# Patient Record
Sex: Male | Born: 1990 | Race: Black or African American | Hispanic: No | Marital: Single | Smoking: Never smoker
Health system: Southern US, Community
[De-identification: ages and names within clinical notes are randomized; demographics above are authoritative.]

## PROBLEM LIST (undated history)

## (undated) DIAGNOSIS — E669 Obesity, unspecified: Secondary | ICD-10-CM

## (undated) DIAGNOSIS — J45909 Unspecified asthma, uncomplicated: Secondary | ICD-10-CM

## (undated) HISTORY — PX: DENTAL SURGERY: SHX609

---
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
## 2013-07-26 ENCOUNTER — Emergency Department (HOSPITAL_COMMUNITY): Payer: Medicaid Other

## 2013-07-26 ENCOUNTER — Emergency Department (HOSPITAL_COMMUNITY)
Admission: EM | Admit: 2013-07-26 | Discharge: 2013-07-26 | Disposition: A | Discharge status: Home / Self Care | Payer: Medicaid Other | Attending: Emergency Medicine | Admitting: Emergency Medicine

## 2013-07-26 ENCOUNTER — Encounter (HOSPITAL_COMMUNITY): Payer: Self-pay | Admitting: Emergency Medicine

## 2013-07-26 DIAGNOSIS — R059 Cough, unspecified: Secondary | ICD-10-CM | POA: Insufficient documentation

## 2013-07-26 DIAGNOSIS — H9190 Unspecified hearing loss, unspecified ear: Secondary | ICD-10-CM | POA: Insufficient documentation

## 2013-07-26 DIAGNOSIS — R6889 Other general symptoms and signs: Secondary | ICD-10-CM | POA: Insufficient documentation

## 2013-07-26 DIAGNOSIS — J3489 Other specified disorders of nose and nasal sinuses: Secondary | ICD-10-CM | POA: Insufficient documentation

## 2013-07-26 DIAGNOSIS — E669 Obesity, unspecified: Secondary | ICD-10-CM | POA: Insufficient documentation

## 2013-07-26 DIAGNOSIS — R05 Cough: Secondary | ICD-10-CM | POA: Insufficient documentation

## 2013-07-26 DIAGNOSIS — J45909 Unspecified asthma, uncomplicated: Secondary | ICD-10-CM | POA: Insufficient documentation

## 2013-07-26 DIAGNOSIS — R0981 Nasal congestion: Secondary | ICD-10-CM

## 2013-07-26 HISTORY — DX: Unspecified asthma, uncomplicated: J45.909

## 2013-07-26 HISTORY — DX: Obesity, unspecified: E66.9

## 2013-07-26 MED ORDER — IPRATROPIUM BROMIDE 0.06 % NA SOLN
2.0000 | Freq: Three times a day (TID) | NASAL | Status: DC
  Administered 2013-07-26: 2 via NASAL
  Filled 2013-07-26: qty 15

## 2013-07-26 MED ORDER — IPRATROPIUM BROMIDE 0.03 % NA SOLN: 2.0000 | Freq: Three times a day (TID) | NASAL | Status: DC

## 2013-07-26 NOTE — ED Notes (Signed)
Pt. reports nasal congestion /productive cough onset yesterday unrelieved by OTC medications . Respirations unlabored .

## 2013-07-26 NOTE — ED Provider Notes (Signed)
CSN: [id number]     Arrival date & time 07/26/13  1955 History   First MD Initiated Contact with Patient 07/26/13 2042     Chief Complaint  Patient presents with  . Nasal Congestion  . Cough     (Consider location/radiation/quality/duration/timing/severity/associated sxs/prior Treatment) HPI Comments: Patient states, for the past, month.  He's had some nasal congestion, has gotten worse over the past couple days, to include sneezing.  At this time, has new personal history of seasonal allergies.  He does have a history of asthma, but denies any shortness of breath, wheezing, denies any fever, sinus pressure.  eye pain, rhinitis Patient states he took one Alka-Seltzer plus, and helped.  His congestion, for about 20 minutes, today  Patient is a 23 y.o. male presenting with URI. The history is provided by the patient.  URI Presenting symptoms: congestion   Presenting symptoms: no cough, no ear pain, no fever, no rhinorrhea and no sore throat   Severity:  Moderate Onset quality:  Gradual Timing:  Constant Progression:  Worsening Chronicity:  New Relieved by:  None tried Worsened by:  Nothing tried Ineffective treatments:  None tried Associated symptoms: sneezing   Associated symptoms: no headaches, no neck pain, no sinus pain and no wheezing   Risk factors: no sick contacts     Past Medical History  Diagnosis Date  . Asthma   . Obesity    History reviewed. No pertinent past surgical history. No family history on file. History  Substance Use Topics  . Smoking status: Never Smoker   . Smokeless tobacco: Not on file  . Alcohol Use: No    Review of Systems  Constitutional: Negative for fever.  HENT: Positive for congestion, hearing loss and sneezing. Negative for ear discharge, ear pain, rhinorrhea, sinus pressure, sore throat and trouble swallowing.   Eyes: Negative for pain.  Respiratory: Negative for cough and wheezing.   Musculoskeletal: Negative for neck pain.   Neurological: Negative for headaches.  All other systems reviewed and are negative.      Allergies  Benadryl and Kiwi extract  Home Medications  No current outpatient prescriptions on file. BP 135/68  Pulse 102  Temp(Src) 99.9 F (37.7 C) (Oral)  Resp 14  SpO2 96% Physical Exam  Nursing note and vitals reviewed. Constitutional: He appears well-developed and well-nourished.  Morbidly obese  HENT:  Head: Normocephalic.  Right Ear: External ear normal.  Left Ear: Decreased hearing is noted.  Nose: Mucosal edema present. No rhinorrhea, sinus tenderness or nasal septal hematoma. No epistaxis. Right sinus exhibits no maxillary sinus tenderness and no frontal sinus tenderness. Left sinus exhibits no maxillary sinus tenderness and no frontal sinus tenderness.  Patient has a cerumen impaction in his left ear  Eyes: Pupils are equal, round, and reactive to light.  Neck: Normal range of motion. Neck supple.  Cardiovascular: Normal rate.   Pulmonary/Chest: Effort normal and breath sounds normal. No respiratory distress. He has no wheezes.  Lymphadenopathy:    He has no cervical adenopathy.  Neurological: He is alert.  Skin: Skin is warm and dry. No erythema.    ED Course  Procedures (including critical care time) Labs Review Labs Reviewed - No data to display Imaging Review No results found.   EKG Interpretation None      MDM  Asked that the patient be given, an Atrovent nasal inhaler, which she will be able to go home with is to use this 2 sprays to each nostril.  3 times  a day.  I will also refer him to ENT at this time.  He is not wheezing.  He does not report any episodes of wheezing, or shortness of breath.  This appears to be all allergy-induced first is URI  Final diagnoses:  Nasal sinus congestion         Arman Filter, NP 07/26/13 2112

## 2013-07-26 NOTE — Discharge Instructions (Signed)
You have been supplied with a nasal spray that, you can safely use over the next several weeks, 2 sprays to each nostril 2-3 times, daily.  You've also been referred to an ENT specialist, if needed

## 2013-07-26 NOTE — ED Notes (Signed)
Patient transported to X-ray 

## 2013-07-26 NOTE — ED Notes (Signed)
Patient returned from X-ray 

## 2013-07-27 NOTE — ED Provider Notes (Signed)
Medical screening examination/treatment/procedure(s) were performed by non-physician practitioner and as supervising physician I was immediately available for consultation/collaboration.   EKG Interpretation None        Keelen Quevedo N Trigger Frasier, DO 07/27/13 0006 

## 2013-11-15 ENCOUNTER — Emergency Department (HOSPITAL_COMMUNITY)
Admission: EM | Admit: 2013-11-15 | Discharge: 2013-11-15 | Disposition: A | Discharge status: Home / Self Care | Payer: Medicaid Other | Attending: Emergency Medicine | Admitting: Emergency Medicine

## 2013-11-15 ENCOUNTER — Encounter (HOSPITAL_COMMUNITY): Payer: Self-pay | Admitting: Emergency Medicine

## 2013-11-15 DIAGNOSIS — R55 Syncope and collapse: Secondary | ICD-10-CM | POA: Diagnosis present

## 2013-11-15 DIAGNOSIS — E669 Obesity, unspecified: Secondary | ICD-10-CM | POA: Insufficient documentation

## 2013-11-15 DIAGNOSIS — E860 Dehydration: Secondary | ICD-10-CM | POA: Insufficient documentation

## 2013-11-15 DIAGNOSIS — G4700 Insomnia, unspecified: Secondary | ICD-10-CM | POA: Diagnosis not present

## 2013-11-15 DIAGNOSIS — Y9302 Activity, running: Secondary | ICD-10-CM | POA: Diagnosis not present

## 2013-11-15 DIAGNOSIS — T675XXA Heat exhaustion, unspecified, initial encounter: Secondary | ICD-10-CM | POA: Diagnosis not present

## 2013-11-15 DIAGNOSIS — Y929 Unspecified place or not applicable: Secondary | ICD-10-CM | POA: Diagnosis not present

## 2013-11-15 DIAGNOSIS — X30XXXA Exposure to excessive natural heat, initial encounter: Secondary | ICD-10-CM | POA: Insufficient documentation

## 2013-11-15 DIAGNOSIS — F410 Panic disorder [episodic paroxysmal anxiety] without agoraphobia: Secondary | ICD-10-CM | POA: Insufficient documentation

## 2013-11-15 DIAGNOSIS — J45909 Unspecified asthma, uncomplicated: Secondary | ICD-10-CM | POA: Insufficient documentation

## 2013-11-15 LAB — BASIC METABOLIC PANEL
ANION GAP: 14 (ref 5–15)
BUN: 10 mg/dL (ref 6–23)
CALCIUM: 10.2 mg/dL (ref 8.4–10.5)
CHLORIDE: 100 meq/L (ref 96–112)
CO2: 25 meq/L (ref 19–32)
Creatinine, Ser: 1.25 mg/dL (ref 0.50–1.35)
GFR calc Af Amer: >90 mL/min (ref >90)
GFR calc non Af Amer: 82 mL/min — ABNORMAL LOW (ref >90)
Glucose, Bld: 115 mg/dL — ABNORMAL HIGH (ref 70–99)
POTASSIUM: 4.8 meq/L (ref 3.7–5.3)
SODIUM: 139 meq/L (ref 137–147)

## 2013-11-15 LAB — CBC
HCT: 43.9 % (ref 39.0–52.0)
Hemoglobin: 14.9 g/dL (ref 13.0–17.0)
MCH: 30 pg (ref 26.0–34.0)
MCHC: 33.9 g/dL (ref 30.0–36.0)
MCV: 88.3 fL (ref 78.0–100.0)
PLATELETS: 277 10*3/uL (ref 150–400)
RBC: 4.97 MIL/uL (ref 4.22–5.81)
RDW: 12.2 % (ref 11.5–15.5)
WBC: 12.5 10*3/uL — AB (ref 4.0–10.5)

## 2013-11-15 LAB — RAPID URINE DRUG SCREEN, HOSP PERFORMED
Amphetamines: NOT DETECTED
BENZODIAZEPINES: NOT DETECTED
Barbiturates: NOT DETECTED
Cocaine: NOT DETECTED
OPIATES: NOT DETECTED
Tetrahydrocannabinol: NOT DETECTED

## 2013-11-15 LAB — TROPONIN I

## 2013-11-15 LAB — CK: CK TOTAL: 192 U/L (ref 7–232)

## 2013-11-15 MED ORDER — SODIUM CHLORIDE 0.9 % IV SOLN
1000.0000 mL | Freq: Once | INTRAVENOUS | Status: AC
  Administered 2013-11-15: 1000 mL via INTRAVENOUS

## 2013-11-15 MED ORDER — SODIUM CHLORIDE 0.9 % IV BOLUS (SEPSIS)
1000.0000 mL | Freq: Once | INTRAVENOUS | Status: AC
  Administered 2013-11-15: 1000 mL via INTRAVENOUS

## 2013-11-15 MED ORDER — SODIUM CHLORIDE 0.9 % IV SOLN
1000.0000 mL | INTRAVENOUS | Status: DC
  Administered 2013-11-15: 1000 mL via INTRAVENOUS

## 2013-11-15 NOTE — ED Provider Notes (Signed)
CSN: [id number]     Arrival date & time 11/15/13  1315 History   First MD Initiated Contact with Patient 11/15/13 1325     Chief Complaint  Patient presents with  . Near Syncope     (Consider location/radiation/quality/duration/timing/severity/associated sxs/prior Treatment) HPI Patient reports he works 2 jobs. He works at [employer] Monday through Friday, he goes to [employer] Monday through Friday, and on the weekends he works at [employer]. He reports he has been on probation at [facility] for being late to work. He was called in to work today, however, he missed his bus. He states he jogged to work(states he used to run track, played football, and do wrestling in high school) and estimates it was about 6 miles. He states he ran it in a little over an hour. He reports it is hot today and he was sweating a lot. He states that he got to work he felt fine however he started having trouble standing up straight without getting very nauseated. He states he started getting worse and then started crying and shaking. He states he felt like he was going to pass out. He denies vomiting, chest pain, extremity cramping, diarrhea. He states he's never felt this way before. He states his manager told him he looked like he was having a panic attack.he reports he continued to have some dizziness while in the ambulance.  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 He however reports he has not been able to sleep for the past couple of years. He states he can go as long as 4 days without sleeping. However, he states typically it can be 30 hours. He states he cannot sleep and then suddenly it hits him and then he has to sleep. He states he may have some depression, he denies suicidal or homicidal ideation. He does report being under a lot of stress. He has thought about getting evaluated for the insomnia however he has not had time.   PCP none  Past Medical History  Diagnosis Date   . Asthma   . Obesity    No past surgical history on file. No family history on file. History  Substance Use Topics  . Smoking status: Never Smoker   . Smokeless tobacco: Not on file  . Alcohol Use: No  employed Psychiatric nurse  Review of Systems  All other systems reviewed and are negative.     Allergies  Benadryl and Kiwi extract  Home Medications   Prior to Admission medications   Not on File   BP 127/65  Pulse 92  Temp(Src) 98.2 F (36.8 C) (Oral)  Resp 18  SpO2 100%  Vital signs normal    Orthostatic VS + for HR when standing and borderline for drop in BP on standing aOrthostatic Lying - BP- Lying: 123/66 mmHg ; Pulse- Lying: 92  Orthostatic Sitting - BP- Sitting: 121/67 mmHg ; Pulse- Sitting: 85  Orthostatic Standing at 0 minutes - BP- Standing at 0 minutes: 109/50 mmHg ; Pulse- Standing at 0 minutes: 105   Physical Exam  Nursing note and vitals reviewed. Constitutional: He is oriented to person, place, and time. He appears well-developed and well-nourished.  Non-toxic appearance. He does not appear ill. No distress.  obese  HENT:  Head: Normocephalic and atraumatic.  Right Ear: External ear normal.  Left Ear: External ear normal.  Nose: Nose normal. No mucosal edema or rhinorrhea.  Mouth/Throat:  Oropharynx is clear and moist and mucous membranes are normal. No dental abscesses or uvula swelling.  Eyes: Conjunctivae and EOM are normal. Pupils are equal, round, and reactive to light.  Neck: Normal range of motion and full passive range of motion without pain. Neck supple.  Cardiovascular: Normal rate, regular rhythm and normal heart sounds.  Exam reveals no gallop and no friction rub.   No murmur heard. Pulmonary/Chest: Effort normal and breath sounds normal. No respiratory distress. He has no wheezes. He has no rhonchi. He has no rales. He exhibits no tenderness and no crepitus.  Abdominal: Soft. Normal appearance and bowel sounds are normal.  He exhibits no distension. There is no tenderness. There is no rebound and no guarding.  Musculoskeletal: Normal range of motion. He exhibits no edema and no tenderness.  Moves all extremities well.   Neurological: He is alert and oriented to person, place, and time. He has normal strength. No cranial nerve deficit.  Skin: Skin is warm, dry and intact. No rash noted. No erythema. No pallor.  Psychiatric: His speech is normal. He is slowed. He expresses no homicidal and no suicidal ideation.  Flat affect    ED Course  Procedures (including critical care time)  Medications  0.9 %  sodium chloride infusion (0 mLs Intravenous Stopped 11/15/13 1540)    Followed by  0.9 %  sodium chloride infusion (1,000 mLs Intravenous New Bag/Given 11/15/13 1542)    Followed by  0.9 %  sodium chloride infusion (not administered)  sodium chloride 0.9 % bolus 1,000 mL (not administered)   Recheck after 2 liter bolus has only about 200 cc of dark urine output. Pt state he is feeling better. Tongue is still dry. Will given more fluids.    Labs Review Results for orders placed during the hospital encounter of 11/15/13  CBC      Result Value Ref Range   WBC 12.5 (*) 4.0 - 10.5 K/uL   RBC 4.97  4.22 - 5.81 MIL/uL   Hemoglobin 14.9  13.0 - 17.0 g/dL   HCT 16.143.9  09.639.0 - 04.552.0 %   MCV 88.3  78.0 - 100.0 fL   MCH 30.0  26.0 - 34.0 pg   MCHC 33.9  30.0 - 36.0 g/dL   RDW 40.912.2  81.111.5 - 91.415.5 %   Platelets 277  150 - 400 K/uL  BASIC METABOLIC PANEL      Result Value Ref Range   Sodium 139  137 - 147 mEq/L   Potassium 4.8  3.7 - 5.3 mEq/L   Chloride 100  96 - 112 mEq/L   CO2 25  19 - 32 mEq/L   Glucose, Bld 115 (*) 70 - 99 mg/dL   BUN 10  6 - 23 mg/dL   Creatinine, Ser 7.821.25  0.50 - 1.35 mg/dL   Calcium 95.610.2  8.4 - 21.310.5 mg/dL   GFR calc non Af Amer 82 (*) >90 mL/min   GFR calc Af Amer >90  >90 mL/min   Anion gap 14  5 - 15  CK      Result Value Ref Range   Total CK 192  7 - 232 U/L  TROPONIN I      Result  Value Ref Range   Troponin I <0.30  <0.30 ng/mL   Laboratory interpretation all normal except leukocytosis     Imaging Review No results found.   EKG Interpretation   Date/Time:  Sunday November 15 2013 13:44:06 EDT Ventricular Rate:  7283  PR Interval:  154 QRS Duration: 112 QT Interval:  367 QTC Calculation: 431 R Axis:   79 Text Interpretation:  Sinus rhythm Consider right atrial enlargement  Incomplete right bundle branch block No old tracing to compare Confirmed  by Vicktoria Muckey  MD-I, Emony Dormer ([id number]) on 11/15/2013 2:36:49 PM      MDM   Final diagnoses:  Dehydration  Heat exhaustion, initial encounter  Insomnia  Panic attack    Plan discharge  Devoria Albe, MD, Franz Dell, MD 11/15/13 1630

## 2013-11-15 NOTE — ED Notes (Signed)
Per EMS: Pt from [facility] on [address].  Pt works there.  Pt states that he missed the bus and had to run several miles to get to work.  Pt states that he works two jobs while also going to school at [employer].  Pt is not doing well in any of his endeavors.  Pt states that he is exhausted from running to work.

## 2013-11-15 NOTE — ED Notes (Signed)
MD at bedside. Dr. Knapp at bedside.  

## 2013-11-15 NOTE — ED Notes (Addendum)
Pt states that he ran about 6 miles to work and after he got there, felt as if he was going to pass out.  Pt states that his manager told him to go on break and he started feeling worse.  Pt states he feels "very tired" right now.  States that he did not sleep last night.

## 2013-11-15 NOTE — Discharge Instructions (Signed)
Drink plenty of fluids. Consider getting evaluated for your insomnia. You can start with a psychiatrist to make sure you can't sleep because or anxiety or depression.  Try to stay cool. Return if you feel worse again.  Insomnia Insomnia is frequent trouble falling and/or staying asleep. Insomnia can be a long term problem or a short term problem. Both are common. Insomnia can be a short term problem when the wakefulness is related to a certain stress or worry. Long term insomnia is often related to ongoing stress during waking hours and/or poor sleeping habits. Overtime, sleep deprivation itself can make the problem worse. Every little thing feels more severe because you are overtired and your ability to cope is decreased. CAUSES   Stress, anxiety, and depression.  Poor sleeping habits.  Distractions such as TV in the bedroom.  Naps close to bedtime.  Engaging in emotionally charged conversations before bed.  Technical reading before sleep.  Alcohol and other sedatives. They may make the problem worse. They can hurt normal sleep patterns and normal dream activity.  Stimulants such as caffeine for several hours prior to bedtime.  Pain syndromes and shortness of breath can cause insomnia.  Exercise late at night.  Changing time zones may cause sleeping problems (jet lag). It is sometimes helpful to have someone observe your sleeping patterns. They should look for periods of not breathing during the night (sleep apnea). They should also look to see how long those periods last. If you live alone or observers are uncertain, you can also be observed at a sleep clinic where your sleep patterns will be professionally monitored. Sleep apnea requires a checkup and treatment. Give your caregivers your medical history. Give your caregivers observations your family has made about your sleep.  SYMPTOMS   Not feeling rested in the morning.  Anxiety and restlessness at bedtime.  Difficulty falling  and staying asleep. TREATMENT   Your caregiver may prescribe treatment for an underlying medical disorders. Your caregiver can give advice or help if you are using alcohol or other drugs for self-medication. Treatment of underlying problems will usually eliminate insomnia problems.  Medications can be prescribed for short time use. They are generally not recommended for lengthy use.  Over-the-counter sleep medicines are not recommended for lengthy use. They can be habit forming.  You can promote easier sleeping by making lifestyle changes such as:  Using relaxation techniques that help with breathing and reduce muscle tension.  Exercising earlier in the day.  Changing your diet and the time of your last meal. No night time snacks.  Establish a regular time to go to bed.  Counseling can help with stressful problems and worry.  Soothing music and white noise may be helpful if there are background noises you cannot remove.  Stop tedious detailed work at least one hour before bedtime. HOME CARE INSTRUCTIONS   Keep a diary. Inform your caregiver about your progress. This includes any medication side effects. See your caregiver regularly. Take note of:  Times when you are asleep.  Times when you are awake during the night.  The quality of your sleep.  How you feel the next day. This information will help your caregiver care for you.  Get out of bed if you are still awake after 15 minutes. Read or do some quiet activity. Keep the lights down. Wait until you feel sleepy and go back to bed.  Keep regular sleeping and waking hours. Avoid naps.  Exercise regularly.  Avoid distractions at bedtime.  Distractions include watching television or engaging in any intense or detailed activity like attempting to balance the household checkbook.  Develop a bedtime ritual. Keep a familiar routine of bathing, brushing your teeth, climbing into bed at the same time each night, listening to  soothing music. Routines increase the success of falling to sleep faster.  Use relaxation techniques. This can be using breathing and muscle tension release routines. It can also include visualizing peaceful scenes. You can also help control troubling or intruding thoughts by keeping your mind occupied with boring or repetitive thoughts like the old concept of counting sheep. You can make it more creative like imagining planting one beautiful flower after another in your backyard garden.  During your day, work to eliminate stress. When this is not possible use some of the previous suggestions to help reduce the anxiety that accompanies stressful situations. MAKE SURE YOU:   Understand these instructions.  Will watch your condition.  Will get help right away if you are not doing well or get worse. Document Released: 04/06/2000 Document Revised: 07/02/2011 Document Reviewed: 05/07/2007 [facility] Patient Information 2015 [place], [place]. This information is not intended to replace advice given to you by your health care provider. Make sure you discuss any questions you have with your health care provider.  Heat-Related Illness Heat-related illnesses occur when the body is unable to properly cool itself. The body normally cools itself by sweating. However, under some conditions sweating is not enough. In these cases, a person's body temperature rises rapidly. Very high body temperatures may damage the brain or other vital organs. Some examples of heat-related illnesses include:  Heat stroke. This occurs when the body is unable to regulate its temperature. The body's temperature rises rapidly, the sweating mechanism fails, and the body is unable to cool down. Body temperature may rise to 106 F (41 C) or higher within 10 to 15 minutes. Heat stroke can cause death or permanent disability if emergency treatment is not provided.  Heat exhaustion. This is a milder form of heat-related illness that can  develop after several days of exposure to high temperatures and not enough fluids. It is the body's response to an excessive loss of the water and salt contained in sweat.  Heat cramps. These usually affect people who sweat a lot during heavy activity. This sweating drains the body's salt and moisture. The low salt level in the muscles causes painful cramps. Heat cramps may also be a symptom of heat exhaustion. Heat cramps usually occur in the abdomen, arms, or legs. Get medical attention for cramps if you have heart problems or are on a low-sodium diet. Those that are at greatest risk for heat-related illnesses include:   The elderly.  Infant and the very young.  People with mental illness and chronic diseases.  People who are overweight (obese).  Young and healthy people can even succumb to heat if they participate in strenuous physical activities during hot weather. CAUSES  Several factors affect the body's ability to cool itself during extremely hot weather. When the humidity is high, sweat will not evaporate as quickly. This prevents the body from releasing heat quickly. Other factors that can affect the body's ability to cool down include:   Age.  Obesity.  Fever.  Dehydration.  Heart disease.  Mental illness.  Poor circulation.  Sunburn.  Prescription drug use.  Alcohol use. SYMPTOMS  Heat stroke: Warning signs of heat stroke vary, but may include:  An extremely high body temperature (above 103F orally).  A fast, strong pulse.  Dizziness.  Confusion.  Red, hot, and dry skin.  No sweating.  Throbbing headache.  Feeling sick to your stomach (nauseous).  Unconsciousness. Heat exhaustion: Warning signs of heat exhaustion include:  Heavy sweating.  Tiredness.  Headache.  Paleness.  Weakness.  Feeling sick to your stomach (nauseous) or vomiting.  Muscle cramps. Heat cramps  Muscle pains or spasms. TREATMENT  Heat stroke  Get into a cool  environment. An indoor place that is air-conditioned may be best.  Take a cool shower or bath. Have someone around to make sure you are okay.  Take your temperature. Make sure it is going down. Heat exhaustion  Drink plenty of fluids. Do not drink liquids that contain caffeine, alcohol, or large amounts of sugar. These cause you to lose more body fluid. Also, avoid very cold drinks. They can cause stomach cramps.  Get into a cool environment. An indoor place that is air-conditioned may be best.  Take a cool shower or bath. Have someone around to make sure you are okay.  Put on lightweight clothing. Heat cramps  Stop whatever activity you were doing. Do not attempt to do that activity for at least 3 hours after the cramps have gone away.  Get into a cool environment. An indoor place that is air-conditioned may be best. HOME CARE INSTRUCTIONS  To protect your health when temperatures are extremely high, follow these tips:  During heavy exercise in a hot environment, drink two to four glasses (16-32 ounces) of cool fluids each hour. Do not wait until you are thirsty to drink. Warning: If your caregiver limits the amount of fluid you drink or has you on water pills, ask how much you should drink while the weather is hot.  Do not drink liquids that contain caffeine, alcohol, or large amounts of sugar. These cause you to lose more body fluid.  Avoid very cold drinks. They can cause stomach cramps.  Wear appropriate clothing. Choose lightweight, light-colored, loose-fitting clothing.  If you must be outdoors, try to limit your outdoor activity to morning and evening hours. Try to rest often in shady areas.  If you are not used to working or exercising in a hot environment, start slowly and pick up the pace gradually.  Stay cool in an air-conditioned place if possible. If your home does not have air conditioning, go to the shopping mall or [employer].  Taking a cool shower or bath may  help you cool off. SEEK MEDICAL CARE IF:   You see any of the symptoms listed above. You may be dealing with a life-threatening emergency.  Symptoms worsen or last longer than 1 hour.  Heat cramps do not get better in 1 hour. MAKE SURE YOU:   Understand these instructions.  Will watch your condition.  Will get help right away if you are not doing well or get worse. Document Released: 01/17/2008 Document Revised: 07/02/2011 Document Reviewed: 01/17/2008 [facility] Patient Information 2015 [place], [place]. This information is not intended to replace advice given to you by your health care provider. Make sure you discuss any questions you have with your health care provider.  Dehydration, Adult Dehydration is when you lose more fluids from the body than you take in. Vital organs like the kidneys, brain, and heart cannot function without a proper amount of fluids and salt. Any loss of fluids from the body can cause dehydration.  CAUSES   Vomiting.  Diarrhea.  Excessive sweating.  Excessive urine output.  Fever. SYMPTOMS  Mild dehydration  Thirst.  Dry lips.  Slightly dry mouth. Moderate dehydration  Very dry mouth.  Sunken eyes.  Skin does not bounce back quickly when lightly pinched and released.  Dark urine and decreased urine production.  Decreased tear production.  Headache. Severe dehydration  Very dry mouth.  Extreme thirst.  Rapid, weak pulse (more than 100 beats per minute at rest).  Cold hands and feet.  Not able to sweat in spite of heat and temperature.  Rapid breathing.  Blue lips.  Confusion and lethargy.  Difficulty being awakened.  Minimal urine production.  No tears. DIAGNOSIS  Your caregiver will diagnose dehydration based on your symptoms and your exam. Blood and urine tests will help confirm the diagnosis. The diagnostic evaluation should also identify the cause of dehydration. TREATMENT  Treatment of mild or moderate  dehydration can often be done at home by increasing the amount of fluids that you drink. It is best to drink small amounts of fluid more often. Drinking too much at one time can make vomiting worse. Refer to the home care instructions below. Severe dehydration needs to be treated at the hospital where you will probably be given intravenous (IV) fluids that contain water and electrolytes. HOME CARE INSTRUCTIONS   Ask your caregiver about specific rehydration instructions.  Drink enough fluids to keep your urine clear or pale yellow.  Drink small amounts frequently if you have nausea and vomiting.  Eat as you normally do.  Avoid:  Foods or drinks high in sugar.  Carbonated drinks.  Juice.  Extremely hot or cold fluids.  Drinks with caffeine.  Fatty, greasy foods.  Alcohol.  Tobacco.  Overeating.  Gelatin desserts.  Wash your hands well to avoid spreading bacteria and viruses.  Only take over-the-counter or prescription medicines for pain, discomfort, or fever as directed by your caregiver.  Ask your caregiver if you should continue all prescribed and over-the-counter medicines.  Keep all follow-up appointments with your caregiver. SEEK MEDICAL CARE IF:  You have abdominal pain and it increases or stays in one area (localizes).  You have a rash, stiff neck, or severe headache.  You are irritable, sleepy, or difficult to awaken.  You are weak, dizzy, or extremely thirsty. SEEK IMMEDIATE MEDICAL CARE IF:   You are unable to keep fluids down or you get worse despite treatment.  You have frequent episodes of vomiting or diarrhea.  You have blood or green matter (bile) in your vomit.  You have blood in your stool or your stool looks black and tarry.  You have not urinated in 6 to 8 hours, or you have only urinated a small amount of very dark urine.  You have a fever.  You faint. MAKE SURE YOU:   Understand these instructions.  Will watch your  condition.  Will get help right away if you are not doing well or get worse. Document Released: 04/09/2005 Document Revised: 07/02/2011 Document Reviewed: 11/27/2010 [facility] Patient Information 2015 [place], [place]. This information is not intended to replace advice given to you by your health care provider. Make sure you discuss any questions you have with your health care provider.

## 2013-11-15 NOTE — ED Notes (Signed)
Pt reminded of need for urine sample. Pt unable to give urine sample at this time.

## 2014-02-20 ENCOUNTER — Encounter (HOSPITAL_COMMUNITY): Payer: Self-pay | Admitting: Emergency Medicine

## 2014-02-20 ENCOUNTER — Emergency Department (HOSPITAL_COMMUNITY)
Admission: EM | Admit: 2014-02-20 | Discharge: 2014-02-20 | Disposition: A | Discharge status: Home / Self Care | Payer: Medicaid Other | Attending: Emergency Medicine | Admitting: Emergency Medicine

## 2014-02-20 DIAGNOSIS — J069 Acute upper respiratory infection, unspecified: Secondary | ICD-10-CM | POA: Diagnosis not present

## 2014-02-20 DIAGNOSIS — R0981 Nasal congestion: Secondary | ICD-10-CM | POA: Diagnosis present

## 2014-02-20 DIAGNOSIS — J45909 Unspecified asthma, uncomplicated: Secondary | ICD-10-CM | POA: Insufficient documentation

## 2014-02-20 DIAGNOSIS — E669 Obesity, unspecified: Secondary | ICD-10-CM | POA: Diagnosis not present

## 2014-02-20 LAB — RAPID STREP SCREEN (MED CTR MEBANE ONLY): Streptococcus, Group A Screen (Direct): NEGATIVE

## 2014-02-20 MED ORDER — HYDROCODONE-ACETAMINOPHEN 7.5-325 MG/15ML PO SOLN: 15.0000 mL | ORAL | Status: DC | PRN

## 2014-02-20 MED ORDER — IBUPROFEN 100 MG/5ML PO SUSP
600.0000 mg | Freq: Once | ORAL | Status: AC
  Administered 2014-02-20: 600 mg via ORAL
  Filled 2014-02-20: qty 30

## 2014-02-20 NOTE — ED Provider Notes (Signed)
Medical screening examination/treatment/procedure(s) were performed by non-physician practitioner and as supervising physician I was immediately available for consultation/collaboration.   EKG Interpretation None        Enid SkeensJoshua M Maclovio Henson, MD 02/20/14 [phone]

## 2014-02-20 NOTE — Discharge Instructions (Signed)
Take the prescribed medication as directed. °Rest and drink plenty of fluids. °Return to the ED for new or worsening symptoms. ° °

## 2014-02-20 NOTE — ED Provider Notes (Signed)
CSN: [id number]     Arrival date & time 02/20/14  1426 History  This chart was scribed for non-physician practitioner working with Enid SkeensJoshua M Zavitz, MD, by Jarvis Morganaylor Ferguson, ED Scribe. This patient was seen in room TR07C/TR07C and the patient's care was started at 2:54 PM.    Chief Complaint  Patient presents with  . Sore Throat  . Nasal Congestion     The history is provided by the patient. No language interpreter was used.   HPI Comments: Jari PiggMaurice Gilland is a 23 y.o. male who presents to the Emergency Department complaining of a constant, moderate, gradually worsening, sore throat for 2 days. Pt states it hurts to swallow and talk. He is having associated congestion and dry cough. Pt admits to having a sick contact through his niece who has been evaluated by a provider. He has not taken anything. He denies having his annual flu shot this year. He denies any chills, fever, nausea, abdominal pain, vomiting or diarrhea. No chest pain or SOB.  No intervention tried PTA.  Past Medical History  Diagnosis Date  . Asthma   . Obesity    History reviewed. No pertinent past surgical history. No family history on file. History  Substance Use Topics  . Smoking status: Never Smoker   . Smokeless tobacco: Not on file  . Alcohol Use: No    Review of Systems  Constitutional: Negative for fever and chills.  HENT: Positive for congestion and sore throat.   Respiratory: Positive for cough.   Gastrointestinal: Negative for nausea, vomiting, abdominal pain and diarrhea.  All other systems reviewed and are negative.     Allergies  Benadryl and Kiwi extract  Home Medications   Prior to Admission medications   Not on File   Triage Vitals: BP 119/68  Pulse 56  Temp(Src) 98.2 F (36.8 C) (Oral)  Resp 18  SpO2 98%  Physical Exam  Nursing note and vitals reviewed. Constitutional: He is oriented to person, place, and time. He appears well-developed and well-nourished.  HENT:  Head:  Normocephalic and atraumatic.  Right Ear: Tympanic membrane and ear canal normal.  Left Ear: Tympanic membrane and ear canal normal.  Nose: Rhinorrhea (clear) present.  Mouth/Throat: Uvula is midline and mucous membranes are normal. No trismus in the jaw. Posterior oropharyngeal erythema present. No oropharyngeal exudate, posterior oropharyngeal edema or tonsillar abscesses.  PND present; Tonsils 1+ bilaterally without exudate; uvula midline without peritonsillar abscess; handling secretions appropriately; no difficulty swallowing or speaking  Eyes: Conjunctivae and EOM are normal. Pupils are equal, round, and reactive to light.  Neck: Normal range of motion. Neck supple.  Cardiovascular: Normal rate, regular rhythm and normal heart sounds.   Pulmonary/Chest: Effort normal and breath sounds normal. No respiratory distress. He has no wheezes.  Musculoskeletal: Normal range of motion.  Neurological: He is alert and oriented to person, place, and time.  Skin: Skin is warm and dry.  Psychiatric: He has a normal mood and affect.    ED Course  Procedures (including critical care time)  DIAGNOSTIC STUDIES: Oxygen Saturation is 98% on RA, normal by my interpretation.    COORDINATION OF CARE: 2:59 PM- Will order rapid strep screen.  Pt advised of plan for treatment and pt agrees.     Labs Review Labs Reviewed  RAPID STREP SCREEN  CULTURE, GROUP A STREP    Imaging Review No results found.   EKG Interpretation None      MDM   Final diagnoses:  Viral URI  23 year old male with URI symptoms for the past 2 days. On exam, patient afebrile and nontoxic in appearance. He does have rhinorrhea and postnasal drip present in his oropharynx.  His tonsils are enlarged, no exudates and uvula remains midline.  Rapid strep negative, culture pending.  Lungs are clear bilaterally without wheezes or rhonchi to suggest CAP.  Constellation of sx likely due to viral URI.  Patient instructed on  supportive care.  Rx hycet for pain.  Discussed plan with patient, he/she acknowledged understanding and agreed with plan of care.  Return precautions given for new or worsening symptoms.  I personally performed the services described in this documentation, which was scribed in my presence. The recorded information has been reviewed and is accurate.   Garlon HatchetLisa M Fredericka Bottcher, PA-C 02/20/14 [phone]

## 2014-02-20 NOTE — ED Notes (Signed)
Pt. Stated, i'v had nose stopped up and my throat is all sore.

## 2014-02-22 LAB — CULTURE, GROUP A STREP

## 2014-02-23 ENCOUNTER — Telehealth (HOSPITAL_BASED_OUTPATIENT_CLINIC_OR_DEPARTMENT_OTHER): Payer: Self-pay | Admitting: Emergency Medicine

## 2014-02-23 NOTE — Progress Notes (Signed)
ED Antimicrobial Stewardship Positive Culture Follow Up   Jari PiggMaurice Mcparland is an 23 y.o. male [facility] with a chief complaint of  Chief Complaint  Patient presents with  . Sore Throat  . Nasal Congestion    Recent Results (from the past 720 hour(s))  Rapid strep screen     Status: None   Collection Time: 02/20/14  2:45 PM  Result Value Ref Range Status   Streptococcus, Group A Screen (Direct) NEGATIVE NEGATIVE Final    Comment: (NOTE) A Rapid Antigen test may result negative if the antigen level in the sample is below the detection level of this test. The FDA has not cleared this test as a stand-alone test therefore the rapid antigen negative result has reflexed to a Group A Strep culture.  Culture, Group A Strep     Status: None   Collection Time: 02/20/14  2:45 PM  Result Value Ref Range Status   Specimen Description THROAT  Final   Special Requests NONE  Final   Culture   Final    GROUP A STREP (S.PYOGENES) ISOLATED [facility]    Report Status 02/22/2014 FINAL  Final    []  Treated with , organism resistant to prescribed antimicrobial [x]  Patient discharged originally without antimicrobial agent and treatment is now indicated  New antibiotic prescription: Amoxicillin 500mg  PO BID x 10 days  ED Provider: Emilia BeckKaitlyn Szekalski, PA-C   Cleon DewDulaney, [place] Robert 02/23/2014, 3:43 PM Infectious Diseases Pharmacist Phone# [phone]

## 2014-02-23 NOTE — Telephone Encounter (Signed)
Post ED Visit - Positive Culture Follow-up: Successful Patient Follow-Up  Culture assessed and recommendations reviewed by: [x]  Wes Dulaney, Pharm.D., BCPS []  Celedonio MiyamotoJeremy Frens, [address] BCPS []  Georgina PillionElizabeth Martin, Pharm.D., BCPS []  [place]am, [address]Pharm[address]D., BCPS, AAHIVP []  Estella HuskMichelle Turner, Pharm.D., BCPS, AAHIVP []  Red ChristiansSamson Lee, Pharm.D. []  Tennis Mustassie Stewart, [place]  Positive strep culture  [x]  Patient discharged without antimicrobial prescription and treatment is now indicated []  Organism is resistant to prescribed ED discharge antimicrobial []  Patient with positive blood cultures  Changes discussed with ED provider: Emilia BeckKaitlyn Szekalski PA   New antibiotic prescription Amoxicillin 500mg  po bid x 10 days [facility] [address]  Contacted patient, date 02/23/14 , time 1115   Dylan MullMiller, Dylan Carpenter 02/23/2014, 11:13 AM

## 2014-12-29 ENCOUNTER — Emergency Department (HOSPITAL_COMMUNITY): Payer: Medicaid Other

## 2014-12-29 ENCOUNTER — Encounter (HOSPITAL_COMMUNITY): Payer: Self-pay | Admitting: *Deleted

## 2014-12-29 ENCOUNTER — Emergency Department (HOSPITAL_COMMUNITY)
Admission: EM | Admit: 2014-12-29 | Discharge: 2014-12-29 | Disposition: A | Discharge status: Home / Self Care | Payer: Medicaid Other | Attending: Emergency Medicine | Admitting: Emergency Medicine

## 2014-12-29 DIAGNOSIS — W231XXA Caught, crushed, jammed, or pinched between stationary objects, initial encounter: Secondary | ICD-10-CM | POA: Insufficient documentation

## 2014-12-29 DIAGNOSIS — E669 Obesity, unspecified: Secondary | ICD-10-CM | POA: Insufficient documentation

## 2014-12-29 DIAGNOSIS — J45909 Unspecified asthma, uncomplicated: Secondary | ICD-10-CM | POA: Insufficient documentation

## 2014-12-29 DIAGNOSIS — M25532 Pain in left wrist: Secondary | ICD-10-CM

## 2014-12-29 DIAGNOSIS — Y9289 Other specified places as the place of occurrence of the external cause: Secondary | ICD-10-CM | POA: Insufficient documentation

## 2014-12-29 DIAGNOSIS — Y9389 Activity, other specified: Secondary | ICD-10-CM | POA: Insufficient documentation

## 2014-12-29 DIAGNOSIS — Y998 Other external cause status: Secondary | ICD-10-CM | POA: Insufficient documentation

## 2014-12-29 DIAGNOSIS — S6992XA Unspecified injury of left wrist, hand and finger(s), initial encounter: Secondary | ICD-10-CM | POA: Insufficient documentation

## 2014-12-29 NOTE — ED Notes (Signed)
Declined W/C at D/C and was escorted to lobby by RN. 

## 2014-12-29 NOTE — ED Provider Notes (Signed)
CSN: [id number]     Arrival date & time 12/29/14  0820 History   First MD Initiated Contact with Patient 12/29/14 0831     Chief Complaint  Patient presents with  . Wrist Pain   HPI   24 year old male presents today with left wrist pain. Patient reports that he was at work several days ago when his wrist was caught in a Sales promotion account executive handle causing it to be forced into extension. He reports immediate pain to the wrists, notes that when he does not move it or touch it he has no pain. Significant pain with flexion extension ulnar and lateral deviation of the wrist, palpation of the distal ulna. Patient denies any bleeding or soft tissue injury. Patient denies any pain to his hand or upper upper extremities-the wrist.  Past Medical History  Diagnosis Date  . Asthma   . Obesity    Past Surgical History  Procedure Laterality Date  . Dental surgery     History reviewed. No pertinent family history. Social History  Substance Use Topics  . Smoking status: Never Smoker   . Smokeless tobacco: None  . Alcohol Use: No    Review of Systems  All other systems reviewed and are negative.     Allergies  Benadryl and Kiwi extract  Home Medications   Prior to Admission medications   Medication Sig Start Date End Date Taking? Authorizing Provider  HYDROcodone-acetaminophen (HYCET) 7.5-325 mg/15 ml solution Take 15 mLs by mouth every 4 (four) hours as needed for moderate pain or severe pain. 02/20/14   Garlon Hatchet, PA-C   BP 124/73 mmHg  Pulse 74  Temp(Src) 97.9 F (36.6 C) (Oral)  Resp 18  SpO2 100% Physical Exam  Constitutional: He is oriented to person, place, and time. He appears well-developed and well-nourished.  HENT:  Head: Normocephalic and atraumatic.  Eyes: Conjunctivae are normal. Pupils are equal, round, and reactive to light. Right eye exhibits no discharge. Left eye exhibits no discharge. No scleral icterus.  Neck: Normal range of motion. No JVD present. No tracheal  deviation present.  Pulmonary/Chest: Effort normal. No stridor.  Musculoskeletal:  Tenderness to palpation of left wrist, no obvious swelling or deformity, full active range of motion. Hand atraumatic nontender to palpation remainder of upper extremity nontender to palpation, atraumatic. Sensation grossly intact, cap refill less than 3 seconds, radial pulses 2+, grip strength 5 out of 5  Neurological: He is alert and oriented to person, place, and time. Coordination normal.  Psychiatric: He has a normal mood and affect. His behavior is normal. Judgment and thought content normal.  Nursing note and vitals reviewed.   ED Course  Procedures (including critical care time) Labs Review Labs Reviewed - No data to display  Imaging Review Dg Wrist Complete Left  12/29/2014   CLINICAL DATA:  Pain following fall  EXAM: LEFT WRIST - COMPLETE 3+ VIEW  COMPARISON:  None.  FINDINGS: Frontal, oblique, lateral, and ulnar deviation scaphoid images were obtained. There is no fracture or dislocation. Joint spaces appear intact. No erosive change. There is a small benign exostosis arising from the lateral aspect of distal radial metaphysis.  IMPRESSION: No fracture or dislocation.  No appreciable arthropathy.   Electronically Signed   By: Bretta Bang III M.D.   On: 12/29/2014 09:05   I have personally reviewed and evaluated these images and lab results as part of my medical decision-making.   EKG Interpretation None      MDM   Final  diagnoses:  Left wrist pain  Labs:  Imaging: Left wrist no significant findings  Consults:  Therapeutics: Wrist splint  Discharge Meds:   Assessment/Plan: Patient presents with wrist pain, no acute findings. Wrist splint applied, ice, ibuprofen, Tylenol, follow-up with primary care.          Eyvonne Mechanic, PA-C 12/30/14 1730  Azalia Bilis, MD 01/01/15 [phone]

## 2014-12-29 NOTE — ED Notes (Addendum)
PT reports hurting his LT wrist while at work around 0200 today. PT reports his wrist was  in a pallet jack handle.

## 2014-12-29 NOTE — Discharge Instructions (Signed)
Please use splint as needed. Please use ibuprofen, Tylenol, ice as needed for pain. Please follow-up with your primary care provider and 1-2 weeks for reevaluation. Monitor for new or worsening signs or symptoms, return immediately if any present.

## 2016-11-14 IMAGING — CR DG WRIST COMPLETE 3+V*L*
4 series · 4 of 4 positions shown · non-contrast
Comparison: None.

CLINICAL DATA: Pain following fall

EXAM:
LEFT WRIST - COMPLETE 3+ VIEW

[wrist pa]
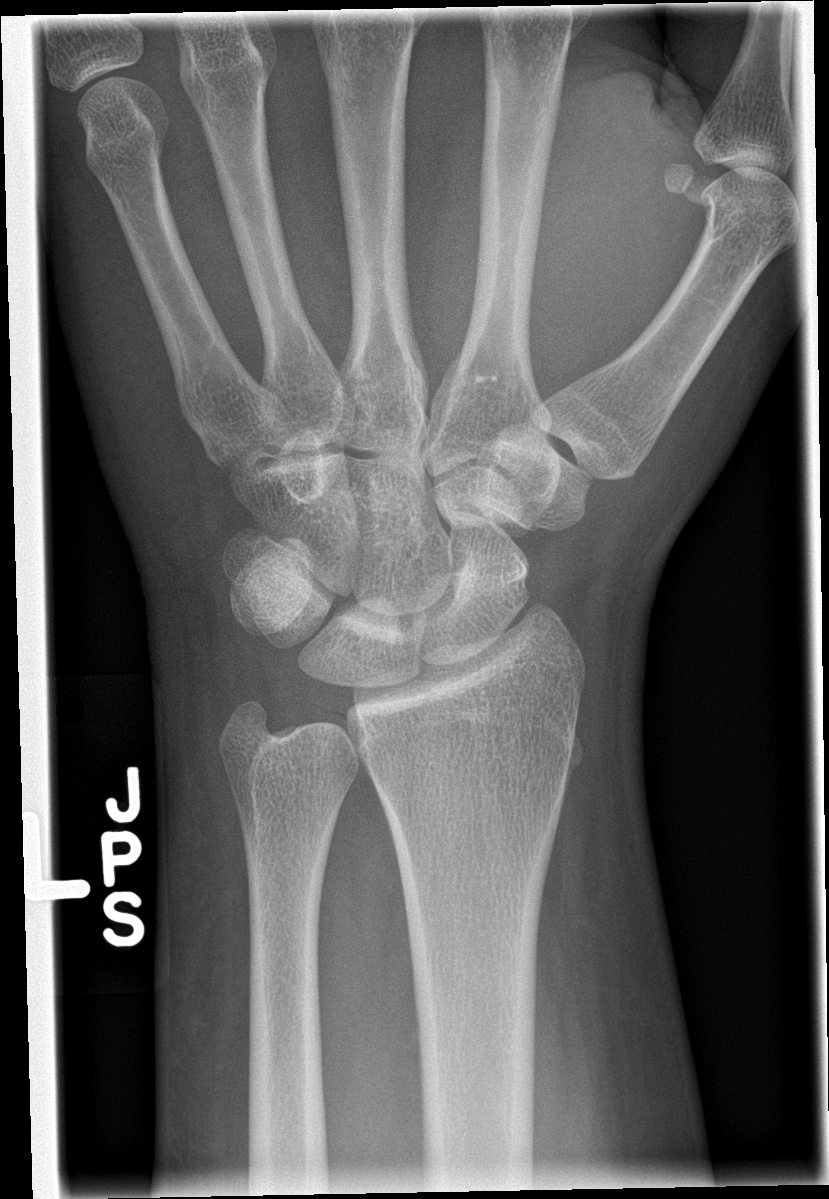

[wrist obl]
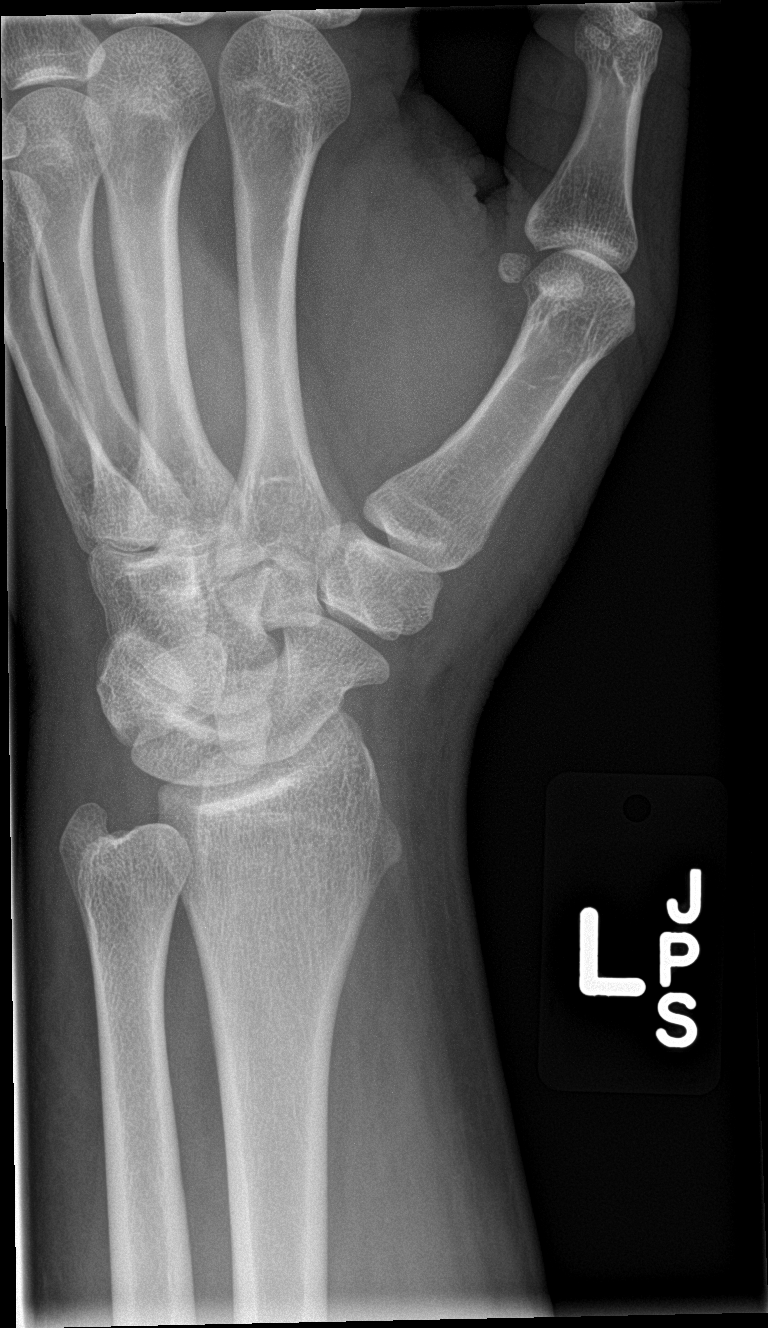

[wrist lat]
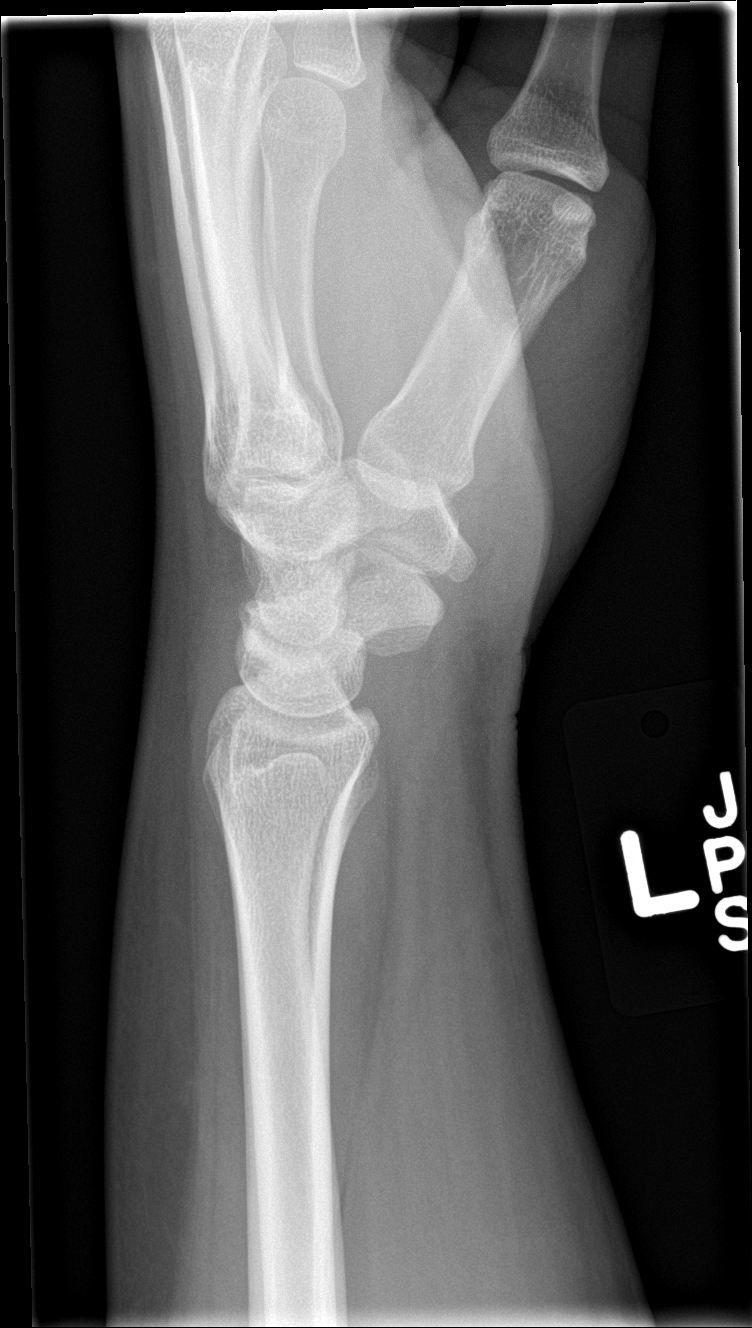

[wrist navicular]
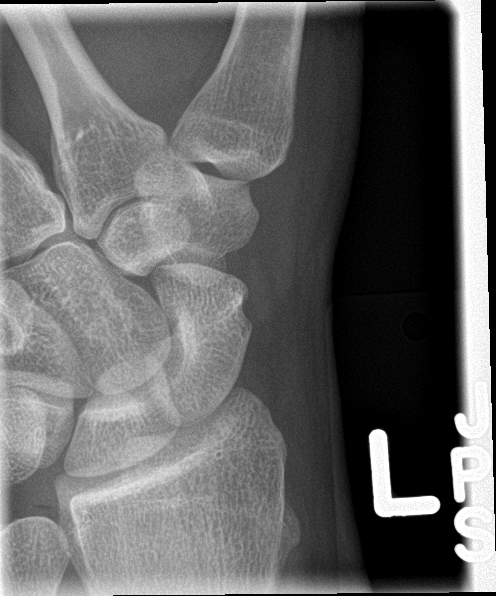

[4 of 4 positions shown; findings below may reference images not displayed]

FINDINGS: Frontal, oblique, lateral, and ulnar deviation scaphoid images were
obtained. There is no fracture or dislocation. Joint spaces appear
intact. No erosive change. There is a small benign exostosis arising
from the lateral aspect of distal radial metaphysis.
IMPRESSION: No fracture or dislocation.  No appreciable arthropathy.

## 2017-10-24 ENCOUNTER — Other Ambulatory Visit: Payer: Self-pay

## 2017-10-24 ENCOUNTER — Encounter (HOSPITAL_COMMUNITY): Payer: Self-pay | Admitting: Emergency Medicine

## 2017-10-24 ENCOUNTER — Emergency Department (HOSPITAL_COMMUNITY)
Admission: EM | Admit: 2017-10-24 | Discharge: 2017-10-24 | Disposition: A | Discharge status: Home / Self Care | Payer: Self-pay | Attending: Emergency Medicine | Admitting: Emergency Medicine

## 2017-10-24 DIAGNOSIS — J45909 Unspecified asthma, uncomplicated: Secondary | ICD-10-CM | POA: Insufficient documentation

## 2017-10-24 DIAGNOSIS — S80861A Insect bite (nonvenomous), right lower leg, initial encounter: Secondary | ICD-10-CM | POA: Insufficient documentation

## 2017-10-24 DIAGNOSIS — Y999 Unspecified external cause status: Secondary | ICD-10-CM | POA: Insufficient documentation

## 2017-10-24 DIAGNOSIS — W57XXXA Bitten or stung by nonvenomous insect and other nonvenomous arthropods, initial encounter: Secondary | ICD-10-CM | POA: Insufficient documentation

## 2017-10-24 DIAGNOSIS — Y939 Activity, unspecified: Secondary | ICD-10-CM | POA: Insufficient documentation

## 2017-10-24 DIAGNOSIS — Y929 Unspecified place or not applicable: Secondary | ICD-10-CM | POA: Insufficient documentation

## 2017-10-24 MED ORDER — TRIAMCINOLONE ACETONIDE 0.5 % EX OINT: 1.0000 "application " | TOPICAL_OINTMENT | Freq: Two times a day (BID) | CUTANEOUS | 0 refills | Status: DC

## 2017-10-24 NOTE — ED Provider Notes (Signed)
[facility] Provider Note   CSN: [id number] Arrival date & time: 10/24/17  [id number]     History   Chief Complaint Chief Complaint  Patient presents with  . Insect Bite    HPI Dylan Carpenter is a 27 y.o. male.  The history is provided by the patient. No language interpreter was used.  Leg Pain   This is a new problem. The current episode started 2 days ago. The problem occurs constantly. The problem has been gradually worsening. The pain is present in the right upper leg. The pain is mild. Associated symptoms include itching. He has tried nothing for the symptoms. The treatment provided no relief. There has been no history of extremity trauma.   Pt reports he was bitten by something 2 days ago.  Pt thinks he may have been bitten by a spider  Past Medical History:  Diagnosis Date  . Asthma   . Obesity     There are no active problems to display for this patient.   Past Surgical History:  Procedure Laterality Date  . DENTAL SURGERY          Home Medications    Prior to Admission medications   Medication Sig Start Date End Date Taking? Authorizing Provider  HYDROcodone-acetaminophen (HYCET) 7.5-325 mg/15 ml solution Take 15 mLs by mouth every 4 (four) hours as needed for moderate pain or severe pain. 02/20/14   Garlon HatchetSanders, Lisa M, PA-C  triamcinolone ointment (KENALOG) 0.5 % Apply 1 application topically 2 (two) times daily. 10/24/17   Elson AreasSofia, Leslie K, PA-C    Family History No family history on file.  Social History Social History   Tobacco Use  . Smoking status: Never Smoker  . Smokeless tobacco: Never Used  Substance Use Topics  . Alcohol use: No  . Drug use: No     Allergies   Benadryl [diphenhydramine hcl (sleep)] and Kiwi extract   Review of Systems Review of Systems  Skin: Positive for itching.  All other systems reviewed and are negative.    Physical Exam Updated Vital Signs BP (!) 122/58 (BP Location: Right  Arm)   Pulse 75   Temp 98.7 F (37.1 C) (Oral)   Resp 14   Ht 5\' 11"  (1.803 m)   Wt (!) 149.2 kg (329 lb)   SpO2 100%   BMI 45.89 kg/m   Physical Exam  Constitutional: He appears well-developed and well-nourished.  Neurological: He is alert.  Skin: Skin is warm. There is erythema.  10 cm erythematous area, slight warmth,   Psychiatric: He has a normal mood and affect.  Nursing note and vitals reviewed.    ED Treatments / Results  Labs (all labs ordered are listed, but only abnormal results are displayed) Labs Reviewed - No data to display  EKG None  Radiology No results found.  Procedures Procedures (including critical care time)  Medications Ordered in ED Medications - No data to display   Initial Impression / Assessment and Plan / ED Course  I have reviewed the triage vital signs and the nursing notes.  Pertinent labs & imaging results that were available during my care of the patient were reviewed by me and considered in my medical decision making (see chart for details).     I suspect local reaction, no evidence of infection   Final Clinical Impressions(s) / ED Diagnoses   Final diagnoses:  Insect bite of right lower leg, initial encounter    ED Discharge Orders  Ordered    triamcinolone ointment (KENALOG) 0.5 %  2 times daily     10/24/17 1928    An After Visit Summary was printed and given to the patient.    [name] 10/24/17 2352    Loren Racer, MD 10/28/17 [phone]

## 2017-10-24 NOTE — Discharge Instructions (Signed)
Return if any problems.

## 2017-10-24 NOTE — ED Triage Notes (Signed)
Pt awoke a few days ago with a "bite" to his right ankle. Painful at this time. Itching "on the inside".

## 2018-01-14 ENCOUNTER — Emergency Department (HOSPITAL_COMMUNITY)
Admission: EM | Admit: 2018-01-14 | Discharge: 2018-01-14 | Disposition: A | Discharge status: Home / Self Care | Payer: Self-pay | Attending: Emergency Medicine | Admitting: Emergency Medicine

## 2018-01-14 ENCOUNTER — Other Ambulatory Visit: Payer: Self-pay

## 2018-01-14 DIAGNOSIS — K029 Dental caries, unspecified: Secondary | ICD-10-CM | POA: Insufficient documentation

## 2018-01-14 DIAGNOSIS — J45909 Unspecified asthma, uncomplicated: Secondary | ICD-10-CM | POA: Insufficient documentation

## 2018-01-14 MED ORDER — CHLORHEXIDINE GLUCONATE 0.12 % MT SOLN: 15.0000 mL | Freq: Two times a day (BID) | OROMUCOSAL | 0 refills | Status: DC

## 2018-01-14 MED ORDER — PENICILLIN V POTASSIUM 500 MG PO TABS: 500.0000 mg | ORAL_TABLET | Freq: Four times a day (QID) | ORAL | 0 refills | Status: AC

## 2018-01-14 NOTE — Discharge Instructions (Signed)
Please return to the Emergency Department for any new or worsening symptoms or if your symptoms do not improve. Please be sure to follow up with your Primary Care Physician as soon as possible regarding your visit today. If you do not have a Primary Doctor please use the resources below to establish one. Please use the antibiotic medication penicillin as prescribed.  You may also use the mouthwash Peridex as prescribed, do not swallow the Peridex. Please take Ibuprofen (Advil, motrin) and Tylenol (acetaminophen) to relieve your pain.  You may take up to 400 MG (2 pills) of normal strength ibuprofen every 8 hours as needed.  In between doses of ibuprofen you make take tylenol, up to 500 mg (one extra strength pill).  Do not take more than 3,000 mg tylenol in a 24 hour period.  Please check all medication labels as many medications such as pain and cold medications may contain tylenol.  Do not drink alcohol while taking these medications.  Do not take other NSAID'S while taking ibuprofen (such as aleve or naproxen).  Please take ibuprofen with food to decrease stomach upset. You must follow-up with a dentist as soon as possible.  A dentist is the only person who can fix your teeth, please use the resources below to find a dentist.  Contact a health care provider if: Your pain is not controlled with medicines. Your symptoms are worse. You have new symptoms. Get help right away if: You are unable to open your mouth. You are having trouble breathing or swallowing. You have a fever. Your face, neck, or jaw is swollen.  Do not take your medicine if  develop an itchy rash, swelling in your mouth or lips, or difficulty breathing.   RESOURCE GUIDE  Chronic Pain Problems: Contact [facility]  [phone] Patients need to be referred by their primary care doctor.  Insufficient Money for Medicine: Contact United Way:  call "211" or [facility] [phone].  No Primary Care  Doctor: Call [facility] Connect  [phone] - can help you locate a primary care doctor that  accepts your insurance, provides certain services, etc. Physician Referral Service- [phone]  Agencies that provide inexpensive medical care: [facility]  [phone] [facility]  [phone] [facility]  [phone] [facility]  [phone] Planned [facility]  [phone] [facility]  [phone]  Medicaid-accepting [facility] [facility] Providers: [facility]- [address], [address]  [phone], Mon-Fri 9am-7pm, Sat 9am-1pm [facility]- [address], [address] [place]  [phone] [facility]- [address], [address] [place]  [phone] [facility]- [address]  [phone] Renaye RakersVeita Bland- [address], [address], [phone]  Only accepts [place] patients after they have their name  applied to their card  Self Pay (no insurance) in [facility]: Sickle Cell Patients: Dr Willey BladeEric Dean, [facility]  [address], [phone] [facility]- [address]  [phone]       Redge Gainer-     [facility]- [address], [address]       -     [facility]- see information above (Speak to CitigroupPam H if you do not have insurance)       -  [facility]- [address], [phone]       -  [facility]  [place]- [address]  [phone]       -  [facility]- [address], [phone]       -  Dr Julio Sicks-  [address], [place], [phone]       -  [facility]- [address], [phone]       -  [facility]- [address], [phone], also [address] [phone]       -    [facility]- [address] [place], [phone], 1st & 3rd Saturday   every month, 10am-1pm  1) Find a Doctor and Pay Out of Pocket Although you won't have  to find out who is covered by your insurance plan, it is a good idea to ask around and get recommendations. You will then need to call the office and see if the doctor you have chosen will accept you as a new patient and what types of options they offer for patients who are self-pay. Some doctors offer discounts or will set up payment plans for their patients who do not have insurance, but you will need to ask so you aren't surprised when you get to your appointment.  2) Contact Your Local Health Department Not all health departments have doctors that can see patients for sick visits, but many do, so it is worth a call to see if yours does. If you don't know where your local health department is, you can check in your phone book. The CDC also has a tool to help you locate your state's health department, and many state websites also have listings of all of their local health departments.  3) Find a Walk-in Clinic If your illness is not likely to be very severe or complicated, you may want to try a walk in clinic. These are popping up all over the country in pharmacies, drugstores, and shopping centers. They're usually staffed by nurse practitioners or physician assistants that have been trained to treat common illnesses and complaints. They're usually fairly quick and inexpensive. However, if you have serious medical issues or chronic medical problems, these are probably not your best option  STD Testing [facility] Department of [place] [facility], STD Clinic, [address] [place], phone [phone] or [phone].  Monday - Friday, call for an appointment. [facility] of [employer], STD Clinic, [place] [address], [place], phone [phone] or [phone].  Monday - Friday, call for an appointment.  Abuse/Neglect: [facility] Child Abuse Hotline 562-277-4239 [facility] Child Abuse Hotline 781-385-8794 (After Hours)  Emergency Shelter:   [facility] [phone]  Maternity Homes: Room at the [place] [facility] 3602797396 [facility] Alert Services [phone]  MRSA Hotline #:   314-276-8069  [facility] Resources  [facility] [place]  United Way [facility] Dept. [address]                 [address]         [address]  [address]  [facility] Phone:  [phone]                                  Phone:  [phone]                   Phone:  [phone]  [facility], [place]- [phone]       -     [facility] in [place], [address]                                  [phone], [place] [phone] or [phone] (After Hours)   [place]  Substance Abuse Resources: Alcohol and Drug Services  [phone] [place] [phone] The [place] 3126554282 Chinita Pester [phone] Residential & Outpatient Substance Abuse Program  [phone]  Psychological Services: [place]  [phone] [place], [place]. [address], [place], [place]: [phone] or [phone], PicCapture.uy  Dental Assistance  If unable to pay or uninsured, contact:  [facility] or [facility]. to become qualified for the adult dental clinic.  Patients with Medicaid: [facility] [phone] [address], [place] [address] [phone]  If unable to pay, or uninsured, contact [facility] [phone]) or [place] [phone] in [place], [place] in [facility]) to become qualified for the adult dental clinic   Other  [place]- [place], [place], [place], [address], [place], [place], 2nd and 4th Thursday of the month at 6:30am.  10 clients each day by appointment, can sometimes see walk-in patients if someone does not show for an appointment. [facility]- [address] Hillard Danker [place], [place], [address], [place], [place], [place], [address], [place] Department- [phone] [place] [facility] [phone]

## 2018-01-14 NOTE — ED Notes (Signed)
Patient verbalizes understanding of discharge instructions. Opportunity for questioning and answers were provided. Armband removed by staff, pt discharged from ED ambulatory.   

## 2018-01-14 NOTE — ED Triage Notes (Signed)
Pt complains of right lower dental pain x 2 days. Does not have a dentist. VSS.

## 2018-01-14 NOTE — ED Notes (Signed)
ED Provider at bedside. 

## 2018-01-14 NOTE — ED Provider Notes (Signed)
[facility]   CSN: [id number] Arrival date & time: 01/14/18  1018     History   Chief Complaint Chief Complaint  Patient presents with  . Dental Pain    HPI Dylan Carpenter is a 27 y.o. male presenting for 2 days of right lower dental pain.  Patient states that pain began gradually and has now increased to a moderate, throbbing pain that is constant and worsened with chewing on the right side.  Patient has not taken anything for his pain.  Patient denies history of fever, facial swelling, drainage from the tooth, trouble swallowing or breathing.  Patient denies recent antibiotic use or seeing a provider for this in the past.  HPI  Past Medical History:  Diagnosis Date  . Asthma   . Obesity     There are no active problems to display for this patient.   Past Surgical History:  Procedure Laterality Date  . DENTAL SURGERY          Home Medications    Prior to Admission medications   Medication Sig Start Date End Date Taking? Authorizing Provider  chlorhexidine (PERIDEX) 0.12 % solution Use as directed 15 mLs in the mouth or throat 2 (two) times daily. 01/14/18   Bill SalinasMorelli, Elita Dame A, PA-C  HYDROcodone-acetaminophen (HYCET) 7.5-325 mg/15 ml solution Take 15 mLs by mouth every 4 (four) hours as needed for moderate pain or severe pain. 02/20/14   Garlon HatchetSanders, Lisa M, PA-C  penicillin v potassium (VEETID) 500 MG tablet Take 1 tablet (500 mg total) by mouth 4 (four) times daily for 7 days. 01/14/18 01/21/18  Harlene SaltsMorelli, Jamesha Ellsworth A, PA-C  triamcinolone ointment (KENALOG) 0.5 % Apply 1 application topically 2 (two) times daily. 10/24/17   Elson AreasSofia, Leslie K, PA-C    Family History No family history on file.  Social History Social History   Tobacco Use  . Smoking status: Never Smoker  . Smokeless tobacco: Never Used  Substance Use Topics  . Alcohol use: No  . Drug use: No     Allergies   Benadryl [diphenhydramine hcl (sleep)]  and Kiwi extract   Review of Systems Review of Systems  Constitutional: Negative.  Negative for chills, fatigue and fever.  HENT: Positive for dental problem. Negative for drooling, facial swelling, sore throat, trouble swallowing and voice change.   Musculoskeletal: Negative.  Negative for neck pain and neck stiffness.  Skin: Negative.  Negative for rash.  Neurological: Negative.  Negative for headaches.     Physical Exam Updated Vital Signs BP 121/75 (BP Location: Right Arm)   Pulse 77   Temp 98 F (36.7 C) (Oral)   Resp 16   SpO2 99%   Physical Exam  Constitutional: He is oriented to person, place, and time. He appears well-developed and well-nourished. No distress.  HENT:  Head: Normocephalic and atraumatic.  Right Ear: External ear normal.  Left Ear: External ear normal.  Nose: Nose normal.  Mouth/Throat: Uvula is midline and oropharynx is clear and moist. Mucous membranes are pale. No oral lesions. No trismus in the jaw. Dental caries present. No dental abscesses or uvula swelling. No oropharyngeal exudate, posterior oropharyngeal edema, posterior oropharyngeal erythema or tonsillar abscesses.    To surface dental caries present to indicated tooth.  No gingival erythema, swelling, bleeding or drainage present. Floor mouth is soft, no tongue protrusion.  Eyes: Pupils are equal, round, and reactive to light. Conjunctivae and EOM are normal.  Neck: Trachea normal, normal range of  motion, full passive range of motion without pain and phonation normal. Neck supple. No tracheal tenderness present. No tracheal deviation, no edema and no erythema present.  Pulmonary/Chest: Effort normal. No respiratory distress.  Abdominal: Soft. There is no tenderness. There is no rebound and no guarding.  Musculoskeletal: Normal range of motion.  Neurological: He is alert and oriented to person, place, and time.  Skin: Skin is warm and dry.  Psychiatric: He has a normal mood and affect. His  behavior is normal.     ED Treatments / Results  Labs (all labs ordered are listed, but only abnormal results are displayed) Labs Reviewed - No data to display  EKG None  Radiology No results found.  Procedures Procedures (including critical care time)  Medications Ordered in ED Medications - No data to display   Initial Impression / Assessment and Plan / ED Course  I have reviewed the triage vital signs and the nursing notes.  Pertinent labs & imaging results that were available during my care of the patient were reviewed by me and considered in my medical decision making (see chart for details).    Patient with dental pain. Single tooth with two infected dental surface cavity noted, without signs or symptoms of dental abscess, no swelling/erythema/tenderness of the gums.  Patient is well-appearing, afebrile, nontoxic, speaking well.  Patient able to swallow without pain.  No signs of swelling or concern for Ludwig's angina/Peritonsilar abscess/Retropharyngeal abscess or other deep tissue infections.  No sign of swelling of the neck, patient has good range of motion of the neck, no trismus.  No facial swelling, ocular entrapment or signs of preseptal/orbital cellulitis. Will treat with penicillin VK, Peridex and over-the-counter anti-inflammatories. Patient urged to follow-up with dentist.  At this time there does not appear to be any evidence of an acute emergency medical condition and the patient appears stable for discharge with appropriate outpatient follow up. Diagnosis was discussed with patient who verbalizes understanding of care plan and is agreeable to discharge. I have discussed return precautions with patient who verbalizes understanding of return precautions. Patient strongly encouraged to follow-up with their PCP. All questions answered.   Note: Portions of this report may have been transcribed using voice recognition software. Every effort was made to ensure  accuracy; however, inadvertent computerized transcription errors may still be present.   Final Clinical Impressions(s) / ED Diagnoses   Final diagnoses:  Pain due to dental caries    ED Discharge Orders         Ordered    penicillin v potassium (VEETID) 500 MG tablet  4 times daily     01/14/18 1114    chlorhexidine (PERIDEX) 0.12 % solution  2 times daily     01/14/18 1114           Elizabeth Palau 01/14/18 1126    Mesner, Barbara Cower, MD 01/14/18 [phone]

## 2018-02-24 ENCOUNTER — Emergency Department (HOSPITAL_COMMUNITY)
Admission: EM | Admit: 2018-02-24 | Discharge: 2018-02-24 | Disposition: A | Discharge status: Home / Self Care | Payer: Self-pay | Attending: Emergency Medicine | Admitting: Emergency Medicine

## 2018-02-24 ENCOUNTER — Encounter (HOSPITAL_COMMUNITY): Payer: Self-pay | Admitting: Emergency Medicine

## 2018-02-24 ENCOUNTER — Other Ambulatory Visit: Payer: Self-pay

## 2018-02-24 DIAGNOSIS — J45909 Unspecified asthma, uncomplicated: Secondary | ICD-10-CM | POA: Insufficient documentation

## 2018-02-24 DIAGNOSIS — R197 Diarrhea, unspecified: Secondary | ICD-10-CM | POA: Insufficient documentation

## 2018-02-24 LAB — COMPREHENSIVE METABOLIC PANEL
ALBUMIN: 3.8 g/dL (ref 3.5–5.0)
ALT: 21 U/L (ref 0–44)
AST: 27 U/L (ref 15–41)
Alkaline Phosphatase: 60 U/L (ref 38–126)
Anion gap: 2 — ABNORMAL LOW (ref 5–15)
BUN: 8 mg/dL (ref 6–20)
CO2: 26 mmol/L (ref 22–32)
CREATININE: 0.8 mg/dL (ref 0.61–1.24)
Calcium: 8.4 mg/dL — ABNORMAL LOW (ref 8.9–10.3)
Chloride: 108 mmol/L (ref 98–111)
GFR calc Af Amer: >60 mL/min (ref >60)
GFR calc non Af Amer: >60 mL/min (ref >60)
Glucose, Bld: 100 mg/dL — ABNORMAL HIGH (ref 70–99)
POTASSIUM: 3.8 mmol/L (ref 3.5–5.1)
SODIUM: 136 mmol/L (ref 135–145)
Total Bilirubin: 1.1 mg/dL (ref 0.3–1.2)
Total Protein: 8 g/dL (ref 6.5–8.1)

## 2018-02-24 LAB — CBC
HEMATOCRIT: 47.1 % (ref 39.0–52.0)
HEMOGLOBIN: 14.3 g/dL (ref 13.0–17.0)
MCH: 27.9 pg (ref 26.0–34.0)
MCHC: 30.4 g/dL (ref 30.0–36.0)
MCV: 91.8 fL (ref 80.0–100.0)
Platelets: 324 10*3/uL (ref 150–400)
RBC: 5.13 MIL/uL (ref 4.22–5.81)
RDW: 13.2 % (ref 11.5–15.5)
WBC: 11.3 10*3/uL — ABNORMAL HIGH (ref 4.0–10.5)
nRBC: 0 % (ref 0.0–0.2)

## 2018-02-24 LAB — GASTROINTESTINAL PANEL BY PCR, STOOL (REPLACES STOOL CULTURE)
Adenovirus F40/41: DETECTED — AB
Astrovirus: NOT DETECTED
CRYPTOSPORIDIUM: NOT DETECTED
Campylobacter species: NOT DETECTED
Cyclospora cayetanensis: NOT DETECTED
ENTEROAGGREGATIVE E COLI (EAEC): NOT DETECTED
Entamoeba histolytica: NOT DETECTED
Enteropathogenic E coli (EPEC): NOT DETECTED
Enterotoxigenic E coli (ETEC): NOT DETECTED
GIARDIA LAMBLIA: NOT DETECTED
Norovirus GI/GII: NOT DETECTED
PLESIMONAS SHIGELLOIDES: NOT DETECTED
ROTAVIRUS A: NOT DETECTED
SALMONELLA SPECIES: NOT DETECTED
SAPOVIRUS (I, II, IV, AND V): NOT DETECTED
SHIGELLA/ENTEROINVASIVE E COLI (EIEC): NOT DETECTED
Shiga like toxin producing E coli (STEC): NOT DETECTED
Vibrio cholerae: NOT DETECTED
Vibrio species: NOT DETECTED
YERSINIA ENTEROCOLITICA: NOT DETECTED

## 2018-02-24 LAB — C DIFFICILE QUICK SCREEN W PCR REFLEX
C DIFFICILE (CDIFF) INTERP: NOT DETECTED
C Diff antigen: NEGATIVE
C Diff toxin: NEGATIVE

## 2018-02-24 MED ORDER — LOPERAMIDE HCL 2 MG PO CAPS
4.0000 mg | ORAL_CAPSULE | Freq: Once | ORAL | Status: AC
  Administered 2018-02-24: 4 mg via ORAL
  Filled 2018-02-24: qty 2

## 2018-02-24 MED ORDER — SODIUM CHLORIDE 0.9 % IV BOLUS
500.0000 mL | Freq: Once | INTRAVENOUS | Status: AC
  Administered 2018-02-24: 500 mL via INTRAVENOUS

## 2018-02-24 NOTE — ED Provider Notes (Signed)
[facility] EMERGENCY DEPARTMENT Provider Note   CSN: [id number] Arrival date & time: 02/24/18  4098     History   Chief Complaint Chief Complaint  Patient presents with  . Diarrhea    HPI Dylan Carpenter is a 27 y.o. male.  27 yo M with a chief complaint of diarrhea.  Going on for the past week.  Started after he started taking amoxicillin for dental pain.  Denies sick contacts.  Denies abdominal pain.  Denies fevers.  Denies dark or bloody stool.  Having about 3 episodes a day.  Felt it got worse overnight and he had about 6 watery bowel movements.  Denies vomiting.  The history is provided by the patient.  Diarrhea   This is a new problem. The current episode started more than 2 days ago. The problem occurs 2 to 4 times per day. The problem has not changed since onset.The stool consistency is described as watery. There has been no fever. Pertinent negatives include no abdominal pain, no vomiting, no chills, no headaches, no arthralgias and no myalgias. He has tried nothing for the symptoms. The treatment provided no relief.    Past Medical History:  Diagnosis Date  . Asthma   . Obesity     There are no active problems to display for this patient.   Past Surgical History:  Procedure Laterality Date  . DENTAL SURGERY          Home Medications    Prior to Admission medications   Medication Sig Start Date End Date Taking? Authorizing Provider  acetaminophen-codeine (TYLENOL #3) 300-30 MG tablet Take 1 tablet by mouth every 6 (six) hours as needed. 02/17/18  Yes [provider]  amoxicillin (AMOXIL) 500 MG capsule Take 500 mg by mouth 3 (three) times daily. 02/17/18  Yes [provider]  chlorhexidine (PERIDEX) 0.12 % solution Use as directed 15 mLs in the mouth or throat 2 (two) times daily. Patient not taking: Reported on 02/24/2018 01/14/18   Bill Salinas, PA-C  HYDROcodone-acetaminophen (HYCET) 7.5-325 mg/15 ml solution Take  15 mLs by mouth every 4 (four) hours as needed for moderate pain or severe pain. Patient not taking: Reported on 02/24/2018 02/20/14   Garlon Hatchet, PA-C  triamcinolone ointment (KENALOG) 0.5 % Apply 1 application topically 2 (two) times daily. Patient not taking: Reported on 02/24/2018 10/24/17   [name]    Family History History reviewed. No pertinent family history.  Social History Social History   Tobacco Use  . Smoking status: Never Smoker  . Smokeless tobacco: Never Used  Substance Use Topics  . Alcohol use: No  . Drug use: No     Allergies   Benadryl [diphenhydramine hcl (sleep)] and Kiwi extract   Review of Systems Review of Systems  Constitutional: Negative for chills and fever.  HENT: Negative for congestion and facial swelling.   Eyes: Negative for discharge and visual disturbance.  Respiratory: Negative for shortness of breath.   Cardiovascular: Negative for chest pain and palpitations.  Gastrointestinal: Positive for diarrhea. Negative for abdominal pain and vomiting.  Musculoskeletal: Negative for arthralgias and myalgias.  Skin: Negative for color change and rash.  Neurological: Negative for tremors, syncope and headaches.  Psychiatric/Behavioral: Negative for confusion and dysphoric mood.     Physical Exam Updated Vital Signs BP 140/64 (BP Location: Right Arm)   Pulse 93   Temp 98.7 F (37.1 C) (Oral)   Resp 18   Ht 6' (1.829 m)  Wt (!) 145.2 kg   SpO2 99%   BMI 43.40 kg/m   Physical Exam  Constitutional: He is oriented to person, place, and time. He appears well-developed and well-nourished.  HENT:  Head: Normocephalic and atraumatic.  Eyes: Pupils are equal, round, and reactive to light. EOM are normal.  Neck: Normal range of motion. Neck supple. No JVD present.  Cardiovascular: Normal rate and regular rhythm. Exam reveals no gallop and no friction rub.  No murmur heard. Pulmonary/Chest: No respiratory distress. He has no  wheezes.  Abdominal: He exhibits no distension and no mass. There is no tenderness. There is no rebound and no guarding.  Benign abdominal exam  Musculoskeletal: Normal range of motion.  Neurological: He is alert and oriented to person, place, and time.  Skin: No rash noted. No pallor.  Psychiatric: He has a normal mood and affect. His behavior is normal.  Nursing note and vitals reviewed.    ED Treatments / Results  Labs (all labs ordered are listed, but only abnormal results are displayed) Labs Reviewed  COMPREHENSIVE METABOLIC PANEL - Abnormal; Notable for the following components:      Result Value   Glucose, Bld 100 (*)    Calcium 8.4 (*)    Anion gap 2 (*)    All other components within normal limits  CBC - Abnormal; Notable for the following components:   WBC 11.3 (*)    All other components within normal limits  GASTROINTESTINAL PANEL BY PCR, STOOL (REPLACES STOOL CULTURE)  C DIFFICILE QUICK SCREEN W PCR REFLEX    EKG None  Radiology No results found.  Procedures Procedures (including critical care time)  Medications Ordered in ED Medications  sodium chloride 0.9 % bolus 500 mL (500 mLs Intravenous New Bag/Given 02/24/18 0851)  loperamide (IMODIUM) capsule 4 mg (has no administration in time range)     Initial Impression / Assessment and Plan / ED Course  I have reviewed the triage vital signs and the nursing notes.  Pertinent labs & imaging results that were available during my care of the patient were reviewed by me and considered in my medical decision making (see chart for details).     27 yo M with a chief complaint of diarrhea.  Going on for the past week.  Having about 3 episodes a day.  Not dark not bloody.  No abdominal pain.  With length of diarrhea will obtain stool studies.  This is likely antibiotic induced diarrhea.  Patient is well-appearing and nontoxic.  Labs were ordered in triage.  His potassium is 3.8.  He has a mild elevated  leukocytosis 11.3.  Hemoglobin is normal.  This point we will discharge the patient home.  Have him do Imodium at home.  8:55 AM:  I have discussed the diagnosis/risks/treatment options with the patient and family and believe the pt to be eligible for discharge home to follow-up with PCP. We also discussed returning to the ED immediately if new or worsening sx occur. We discussed the sx which are most concerning (e.g., sudden worsening pain, fever, inability to tolerate by mouth) that necessitate immediate return. Medications administered to the patient during their visit and any new prescriptions provided to the patient are listed below.  Medications given during this visit Medications  sodium chloride 0.9 % bolus 500 mL (500 mLs Intravenous New Bag/Given 02/24/18 0851)  loperamide (IMODIUM) capsule 4 mg (has no administration in time range)      The patient appears reasonably screen and/or stabilized  for discharge and I doubt any other medical condition or other [facility] requiring further screening, evaluation, or treatment in the ED at this time prior to discharge.    Final Clinical Impressions(s) / ED Diagnoses   Final diagnoses:  Diarrhea, unspecified type    ED Discharge Orders    None       Melene Plan, DO 02/24/18 [phone]

## 2018-02-24 NOTE — ED Notes (Signed)
Pt c/o 8/10 sudden onset midsternal CP, sharp non radiating.  Subsided after 2 minutes and believed to be gas. EKG obtained and pt placed on cardiac monitor to continue monitoring. bp 126/68, sats 100 on ra, pulse 80.

## 2018-02-24 NOTE — ED Triage Notes (Signed)
Pt to ER for evaluation of diarrhea x1 week. Pt is on amoxicillin and reports it started after that. Reports recently had wisdom teeth removed and is on antibiotics for hat reason.

## 2018-02-24 NOTE — Discharge Instructions (Addendum)
Take imodium.  Return for abdominal pain, fever, inability to eat or drink.

## 2018-04-26 ENCOUNTER — Encounter (HOSPITAL_BASED_OUTPATIENT_CLINIC_OR_DEPARTMENT_OTHER): Payer: Self-pay | Admitting: *Deleted

## 2018-04-26 ENCOUNTER — Other Ambulatory Visit: Payer: Self-pay

## 2018-04-26 ENCOUNTER — Emergency Department (HOSPITAL_BASED_OUTPATIENT_CLINIC_OR_DEPARTMENT_OTHER): Payer: No Typology Code available for payment source

## 2018-04-26 ENCOUNTER — Emergency Department (HOSPITAL_BASED_OUTPATIENT_CLINIC_OR_DEPARTMENT_OTHER)
Admission: EM | Admit: 2018-04-26 | Discharge: 2018-04-26 | Disposition: A | Discharge status: Home / Self Care | Payer: No Typology Code available for payment source | Attending: Emergency Medicine | Admitting: Emergency Medicine

## 2018-04-26 DIAGNOSIS — W231XXA Caught, crushed, jammed, or pinched between stationary objects, initial encounter: Secondary | ICD-10-CM | POA: Insufficient documentation

## 2018-04-26 DIAGNOSIS — Y939 Activity, unspecified: Secondary | ICD-10-CM | POA: Diagnosis not present

## 2018-04-26 DIAGNOSIS — Y990 Civilian activity done for income or pay: Secondary | ICD-10-CM | POA: Insufficient documentation

## 2018-04-26 DIAGNOSIS — Y929 Unspecified place or not applicable: Secondary | ICD-10-CM | POA: Insufficient documentation

## 2018-04-26 DIAGNOSIS — M79605 Pain in left leg: Secondary | ICD-10-CM | POA: Diagnosis present

## 2018-04-26 DIAGNOSIS — J45909 Unspecified asthma, uncomplicated: Secondary | ICD-10-CM | POA: Insufficient documentation

## 2018-04-26 DIAGNOSIS — S8012XA Contusion of left lower leg, initial encounter: Secondary | ICD-10-CM | POA: Diagnosis not present

## 2018-04-26 NOTE — ED Notes (Signed)
Patient transported to Ultrasound 

## 2018-04-26 NOTE — ED Provider Notes (Signed)
[facility] EMERGENCY DEPARTMENT Provider Note   CSN: [id number] Arrival date & time: 04/26/18  1452     History   Chief Complaint Chief Complaint  Patient presents with  . Leg Pain    HPI Dylan Carpenter is a 28 y.o. male with history of asthma who presents with a 2-week history of left leg pain after injuring it at work.  He reports his leg got caught between 2 metal pieces.  He had an x-ray completed at urgent care that it happened, however he continues to have pain when he steps up or down.  He reports he was given ibuprofen, however it has not been bad enough for him not to take that.  He reports some skin changes that are itchy sometimes in the area.  He denies any fevers, numbness or tingling, chest pain, shortness of breath, abdominal pain.  HPI  Past Medical History:  Diagnosis Date  . Asthma   . Obesity     There are no active problems to display for this patient.   Past Surgical History:  Procedure Laterality Date  . DENTAL SURGERY          Home Medications    Prior to Admission medications   Medication Sig Start Date End Date Taking? Authorizing Provider  acetaminophen-codeine (TYLENOL #3) 300-30 MG tablet Take 1 tablet by mouth every 6 (six) hours as needed. 02/17/18   [provider]  amoxicillin (AMOXIL) 500 MG capsule Take 500 mg by mouth 3 (three) times daily. 02/17/18   [provider]  chlorhexidine (PERIDEX) 0.12 % solution Use as directed 15 mLs in the mouth or throat 2 (two) times daily. Patient not taking: Reported on 02/24/2018 01/14/18   Bill Salinas, PA-C  HYDROcodone-acetaminophen (HYCET) 7.5-325 mg/15 ml solution Take 15 mLs by mouth every 4 (four) hours as needed for moderate pain or severe pain. Patient not taking: Reported on 02/24/2018 02/20/14   Garlon Hatchet, PA-C  triamcinolone ointment (KENALOG) 0.5 % Apply 1 application topically 2 (two) times daily. Patient not taking: Reported on 02/24/2018 10/24/17    Elson Areas, PA-C    Family History No family history on file.  Social History Social History   Tobacco Use  . Smoking status: Never Smoker  . Smokeless tobacco: Never Used  Substance Use Topics  . Alcohol use: No  . Drug use: No     Allergies   Benadryl [diphenhydramine hcl (sleep)] and Kiwi extract   Review of Systems Review of Systems  Constitutional: Negative for fever.  Musculoskeletal: Positive for myalgias.  Skin: Positive for rash.  Neurological: Negative for numbness.     Physical Exam Updated Vital Signs BP 128/77   Pulse 72   Temp 98.2 F (36.8 C) (Oral)   Resp 20   SpO2 99%   Physical Exam Vitals signs and nursing note reviewed.  Constitutional:      General: He is not in acute distress.    Appearance: He is well-developed. He is not diaphoretic.  HENT:     Head: Normocephalic and atraumatic.     Mouth/Throat:     Pharynx: No oropharyngeal exudate.  Eyes:     General: No scleral icterus.       Right eye: No discharge.        Left eye: No discharge.     Conjunctiva/sclera: Conjunctivae normal.     Pupils: Pupils are equal, round, and reactive to light.  Neck:     Musculoskeletal: Normal  range of motion and neck supple.     Thyroid: No thyromegaly.  Cardiovascular:     Rate and Rhythm: Normal rate and regular rhythm.     Heart sounds: Normal heart sounds. No murmur. No friction rub. No gallop.   Pulmonary:     Effort: Pulmonary effort is normal. No respiratory distress.     Breath sounds: Normal breath sounds. No stridor. No wheezing or rales.  Abdominal:     General: Bowel sounds are normal. There is no distension.     Palpations: Abdomen is soft.     Tenderness: There is no abdominal tenderness. There is no guarding or rebound.  Musculoskeletal:       Legs:  Lymphadenopathy:     Cervical: No cervical adenopathy.  Skin:    General: Skin is warm and dry.     Coloration: Skin is not pale.     Findings: No rash.  Neurological:       Mental Status: He is alert.     Coordination: Coordination normal.      ED Treatments / Results  Labs (all labs ordered are listed, but only abnormal results are displayed) Labs Reviewed - No data to display  EKG None  Radiology [place] Venous Img Lower Unilateral Left  Result Date: 04/26/2018 CLINICAL DATA:  Left leg pain, swelling, and induration since injury 04/14/2018 EXAM: LEFT LOWER EXTREMITY VENOUS DOPPLER ULTRASOUND TECHNIQUE: Gray-scale sonography with graded compression, as well as color Doppler and duplex ultrasound were performed to evaluate the lower extremity deep venous systems from the level of the common femoral vein and including the common femoral, femoral, profunda femoral, popliteal and calf veins including the posterior tibial, peroneal and gastrocnemius veins when visible. The superficial great saphenous vein was also interrogated. Spectral Doppler was utilized to evaluate flow at rest and with distal augmentation maneuvers in the common femoral, femoral and popliteal veins. COMPARISON:  None. FINDINGS: Contralateral Common Femoral Vein: Respiratory phasicity is normal and symmetric with the symptomatic side. No evidence of thrombus. Normal compressibility. Common Femoral Vein: No evidence of thrombus. Normal compressibility, respiratory phasicity and response to augmentation. Saphenofemoral Junction: No evidence of thrombus. Normal compressibility and flow on color Doppler imaging. Profunda Femoral Vein: No evidence of thrombus. Normal compressibility and flow on color Doppler imaging. Femoral Vein: No evidence of thrombus. Normal compressibility, respiratory phasicity and response to augmentation. Popliteal Vein: No evidence of thrombus. Normal compressibility, respiratory phasicity and response to augmentation. Calf Veins: No evidence of thrombus. Normal compressibility and flow on color Doppler imaging. Superficial Great Saphenous Vein: No evidence of thrombus. Normal  compressibility. Venous Reflux:  None. Other Findings: Within the subcutaneous tissues of the medial distal thigh is an elongated 16.9 x 2.8 x 7.0 cm avascular fluid collection. IMPRESSION: 1. No evidence of left lower extremity deep venous thrombosis. 2. Large avascular fluid collection in the medial distal thigh measuring 17 cm in length likely liquefying hematoma given history of recent trauma. Electronically Signed   By: Narda RutherfordMelanie  Sanford M.D.   On: 04/26/2018 18:45    Procedures Procedures (including critical care time)  Medications Ordered in ED Medications - No data to display   Initial Impression / Assessment and Plan / ED Course  I have reviewed the triage vital signs and the nursing notes.  Pertinent labs & imaging results that were available during my care of the patient were reviewed by me and considered in my medical decision making (see chart for details).     Patient presenting  with left medial thigh pain.  There is pain and induration.  DVT ultrasound ruled out DVT, but showed a 17 cm liquefying hematoma.  Patient will be treated with supportive treatment including warm compresses, Ace wraps.  Continue NSAIDs for pain and follow-up to sports medicine as needed.  Return precautions discussed.  Patient understands and agrees with plan.  Patient vital stable throughout ED course and discharged in satisfactory condition.  Patient also evaluated by my attending, Dr. Donnald GarrePfeiffer, who guided the patient's management and agrees with plan.  Final Clinical Impressions(s) / ED Diagnoses   Final diagnoses:  Traumatic hematoma of left lower leg, initial encounter    ED Discharge Orders    None       Emi HolesLaw, Andi Layfield M, PA-C 04/26/18 1954    Arby BarrettePfeiffer, Marcy, MD 04/27/18 [id number]

## 2018-04-26 NOTE — Discharge Instructions (Addendum)
Take ibuprofen that you have as needed for pain.  Use warm compresses 3-4 times daily alternating 20 minutes on, 20 minutes off.  Recommend using Ace bandage to help your body reabsorb the blood.  He will follow-up with Dr. Pearletha Forge if your symptoms are not improving over the next couple weeks.  Please return emergency department he develop any new or worsening symptoms.

## 2018-04-26 NOTE — ED Triage Notes (Signed)
Pt reports injured left leg at work on Dec 23. Pt states he continues to have sharp pain when stepping up and down.

## 2018-06-26 ENCOUNTER — Encounter (HOSPITAL_COMMUNITY): Payer: Self-pay | Admitting: *Deleted

## 2018-06-26 ENCOUNTER — Other Ambulatory Visit: Payer: Self-pay

## 2018-06-26 ENCOUNTER — Emergency Department (HOSPITAL_COMMUNITY)
Admission: EM | Admit: 2018-06-26 | Discharge: 2018-06-26 | Disposition: A | Discharge status: Home / Self Care | Payer: Medicaid Other | Attending: Emergency Medicine | Admitting: Emergency Medicine

## 2018-06-26 DIAGNOSIS — J45909 Unspecified asthma, uncomplicated: Secondary | ICD-10-CM | POA: Insufficient documentation

## 2018-06-26 DIAGNOSIS — B9789 Other viral agents as the cause of diseases classified elsewhere: Secondary | ICD-10-CM

## 2018-06-26 DIAGNOSIS — J069 Acute upper respiratory infection, unspecified: Secondary | ICD-10-CM | POA: Insufficient documentation

## 2018-06-26 MED ORDER — BENZONATATE 200 MG PO CAPS: 200.0000 mg | ORAL_CAPSULE | Freq: Three times a day (TID) | ORAL | 0 refills | Status: AC

## 2018-06-26 MED ORDER — FLUTICASONE PROPIONATE 50 MCG/ACT NA SUSP: 1.0000 | Freq: Every day | NASAL | 2 refills | Status: DC

## 2018-06-26 NOTE — Discharge Instructions (Addendum)
Saline nasal rinse twice daily. Flonase twice daily x 5 days then continue with daily use. Zyrtec daily. Tessalon as needed as prescribed for cough.

## 2018-06-26 NOTE — ED Triage Notes (Signed)
Pt in c/o productive cough with yellow sputum, body aches, sore throat, nasal congestion, denies fever & chills, afebrile in triage, denies SOB, A&Ox4 onset x 1 wk

## 2018-06-26 NOTE — ED Provider Notes (Signed)
[facility] EMERGENCY DEPARTMENT Provider Note   CSN: [id number] Arrival date & time: 06/26/18  0929    History   Chief Complaint Chief Complaint  Patient presents with  . Cough    HPI Dylan Carpenter is a 28 y.o. male.     28 year old male presents with complaint of cough, congestion, body aches and sore throat x1 week.  Cough is productive with yellow sputum, reports thick nasal drainage.  Patient denies fevers, chills, wheezing, shortness of breath.  Patient is a non-smoker, does not vape.  Patient had asthma as a child, has not had complications since.  She has been taking NyQuil and DayQuil with relief of his symptoms however due to viral outbreak patient was required by his work to be seen.  No recent travel, no exposure to anyone who has traveled.  Patient reports working in a cold environment, coworkers with similar symptoms.  No other complaints or concerns.     Past Medical History:  Diagnosis Date  . Asthma   . Obesity     There are no active problems to display for this patient.   Past Surgical History:  Procedure Laterality Date  . DENTAL SURGERY          Home Medications    Prior to Admission medications   Medication Sig Start Date End Date Taking? Authorizing Provider  benzonatate (TESSALON) 200 MG capsule Take 1 capsule (200 mg total) by mouth every 8 (eight) hours for 10 days. 06/26/18 07/06/18  Jeannie Fend, PA-C  fluticasone (FLONASE) 50 MCG/ACT nasal spray Place 1 spray into both nostrils daily. 06/26/18   Jeannie Fend, PA-C    Family History No family history on file.  Social History Social History   Tobacco Use  . Smoking status: Never Smoker  . Smokeless tobacco: Never Used  Substance Use Topics  . Alcohol use: No  . Drug use: No     Allergies   Benadryl [diphenhydramine hcl (sleep)] and Kiwi extract   Review of Systems Review of Systems  Constitutional: Negative for chills, diaphoresis and fever.  HENT:  Positive for congestion, postnasal drip and sore throat. Negative for ear pain, sinus pressure, sinus pain and sneezing.   Respiratory: Positive for cough. Negative for shortness of breath and wheezing.   Musculoskeletal: Positive for myalgias.  Skin: Negative for rash and wound.  Allergic/Immunologic: Negative for immunocompromised state.  Neurological: Negative for headaches.  Hematological: Negative for adenopathy.  Psychiatric/Behavioral: Negative for confusion.  All other systems reviewed and are negative.    Physical Exam Updated Vital Signs Pulse 67   Temp 98.6 F (37 C) (Oral)   Resp 14   SpO2 100%   Physical Exam Vitals signs and nursing note reviewed.  Constitutional:      General: He is not in acute distress.    Appearance: He is well-developed. He is not ill-appearing or toxic-appearing.  HENT:     Head: Normocephalic and atraumatic.     Right Ear: Hearing, tympanic membrane and ear canal normal. No middle ear effusion.     Left Ear: Hearing, tympanic membrane and ear canal normal.  No middle ear effusion.     Nose: Mucosal edema and congestion present.     Mouth/Throat:     Mouth: Mucous membranes are moist.     Pharynx: Uvula midline. Posterior oropharyngeal erythema present. No oropharyngeal exudate or uvula swelling.  Eyes:     Conjunctiva/sclera: Conjunctivae normal.  Neck:     Musculoskeletal:  Neck supple.  Cardiovascular:     Rate and Rhythm: Normal rate and regular rhythm.     Pulses: Normal pulses.     Heart sounds: Normal heart sounds. No murmur.  Pulmonary:     Effort: Pulmonary effort is normal.     Breath sounds: Normal breath sounds. No wheezing.  Lymphadenopathy:     Cervical: No cervical adenopathy.  Skin:    General: Skin is warm and dry.     Findings: No rash.  Neurological:     Mental Status: He is alert and oriented to person, place, and time.  Psychiatric:        Behavior: Behavior normal.      ED Treatments / Results   Labs (all labs ordered are listed, but only abnormal results are displayed) Labs Reviewed - No data to display  EKG None  Radiology No results found.  Procedures Procedures (including critical care time)  Medications Ordered in ED Medications - No data to display   Initial Impression / Assessment and Plan / ED Course  I have reviewed the triage vital signs and the nursing notes.  Pertinent labs & imaging results that were available during my care of the patient were reviewed by me and considered in my medical decision making (see chart for details).  Clinical Course as of Jun 25 936  Thu Jun 26, 2018  [id number] 28 year old male with remote history of asthma presents with complaint of URI symptoms x1 week.  Patient has been afebrile, blood pressure mildly elevated at 130/90 otherwise vitals normal.  No exposure to covid19, suspect viral URI, recommend supportive treatment, can continue with his DayQuil and NyQuil.  Recommend Zyrtec, Flonase, saline sinus rinse.  Given prescription for Tessalon to take as needed for cough.   [LM]    Clinical Course User Index [LM] Jeannie Fend, PA-C    Final Clinical Impressions(s) / ED Diagnoses   Final diagnoses:  Viral URI with cough    ED Discharge Orders         Ordered    fluticasone (FLONASE) 50 MCG/ACT nasal spray  Daily     06/26/18 0926    benzonatate (TESSALON) 200 MG capsule  Every 8 hours     06/26/18 0926           Jeannie Fend, PA-C 06/26/18 3888    Tegeler, Canary Brim, MD 06/26/18 [phone]

## 2018-07-28 ENCOUNTER — Other Ambulatory Visit: Payer: Self-pay

## 2018-07-28 ENCOUNTER — Emergency Department (HOSPITAL_COMMUNITY)
Admission: EM | Admit: 2018-07-28 | Discharge: 2018-07-28 | Disposition: A | Discharge status: Home / Self Care | Payer: Medicaid Other | Attending: Emergency Medicine | Admitting: Emergency Medicine

## 2018-07-28 ENCOUNTER — Encounter (HOSPITAL_COMMUNITY): Payer: Self-pay | Admitting: *Deleted

## 2018-07-28 DIAGNOSIS — J45909 Unspecified asthma, uncomplicated: Secondary | ICD-10-CM | POA: Insufficient documentation

## 2018-07-28 DIAGNOSIS — J069 Acute upper respiratory infection, unspecified: Secondary | ICD-10-CM | POA: Insufficient documentation

## 2018-07-28 MED ORDER — FLUTICASONE PROPIONATE 50 MCG/ACT NA SUSP: 1.0000 | Freq: Every day | NASAL | 2 refills | Status: DC

## 2018-07-28 NOTE — ED Triage Notes (Signed)
PT reports 3 days ago with congested nose. Pt was treated for sinus infection 2 weeks ago. Pt denies fever and Pt did not have fever on arrival to ED. Pt reports he lives with  6 family members. Pt reports his brother had a cough this AM.  Pt not aware of any contact with sick  People.

## 2018-07-28 NOTE — Discharge Instructions (Addendum)
Please read attached information. If you experience any new or worsening signs or symptoms please return to the emergency room for evaluation. Please follow-up with your primary care provider or specialist as discussed. Please use medication prescribed only as directed and discontinue taking if you have any concerning signs or symptoms.   °

## 2018-07-28 NOTE — ED Provider Notes (Signed)
[facility] EMERGENCY DEPARTMENT Provider Note   CSN: [id number] Arrival date & time: 07/28/18  8206   History   Chief Complaint Chief Complaint  Patient presents with  . URI    HPI Dylan Carpenter is a 28 y.o. male.     HPI   39 YOM presents today with complaints of upper respiratory congestion and rhinorrhea.  Patient notes symptoms started approximately 3 days ago with runny nose nasal congestion.  He note several weeks ago he had similar symptoms that completely resolved.  Denies any significant sore throat cough shortness of breath.  He denies any significant past medical history is a non-smoker.  He did not receive the influenza vaccine this year.  Patient notes his brother has a cough presently.  Denies any other infectious exposure, denies any recent travel.  No contact with known positive Covid patient.     Past Medical History:  Diagnosis Date  . Asthma   . Obesity     There are no active problems to display for this patient.   Past Surgical History:  Procedure Laterality Date  . DENTAL SURGERY          Home Medications    Prior to Admission medications   Medication Sig Start Date End Date Taking? Authorizing Provider  fluticasone (FLONASE) 50 MCG/ACT nasal spray Place 1 spray into both nostrils daily. 07/28/18   Eyvonne Mechanic, PA-C    Family History History reviewed. No pertinent family history.  Social History Social History   Tobacco Use  . Smoking status: Never Smoker  . Smokeless tobacco: Never Used  Substance Use Topics  . Alcohol use: No  . Drug use: No     Allergies   Benadryl [diphenhydramine hcl (sleep)] and Kiwi extract   Review of Systems Review of Systems  All other systems reviewed and are negative.    Physical Exam Updated Vital Signs BP 138/84 (BP Location: Right Arm)   Pulse 71   Temp 97.9 F (36.6 C) (Oral)   Resp 18   Ht 6' (1.829 m)   Wt 136.1 kg   SpO2 99%   BMI 40.69 kg/m   Physical  Exam Vitals signs and nursing note reviewed.  Constitutional:      Appearance: He is well-developed.  HENT:     Head: Normocephalic and atraumatic.     Comments: Rhinorrhea noted, oropharynx clear no erythema, bilateral TMs normal Eyes:     General: No scleral icterus.       Right eye: No discharge.        Left eye: No discharge.     Conjunctiva/sclera: Conjunctivae normal.     Pupils: Pupils are equal, round, and reactive to light.  Neck:     Musculoskeletal: Normal range of motion.     Vascular: No JVD.     Trachea: No tracheal deviation.  Pulmonary:     Effort: Pulmonary effort is normal. No respiratory distress.     Breath sounds: Normal breath sounds. No stridor. No wheezing, rhonchi or rales.  Neurological:     Mental Status: He is alert and oriented to person, place, and time.     Coordination: Coordination normal.  Psychiatric:        Behavior: Behavior normal.        Thought Content: Thought content normal.        Judgment: Judgment normal.     ED Treatments / Results  Labs (all labs ordered are listed, but only abnormal results are  displayed) Labs Reviewed - No data to display  EKG None  Radiology No results found.  Procedures Procedures (including critical care time)  Medications Ordered in ED Medications - No data to display   Initial Impression / Assessment and Plan / ED Course  I have reviewed the triage vital signs and the nursing notes.  Pertinent labs & imaging results that were available during my care of the patient were reviewed by me and considered in my medical decision making (see chart for details).       Assessment/Plan: 28 year old male with likely viral URI.  Patient with no signs of systemic illness, no severe signs or symptoms presently.  Patient discharged with symptomatic care and strict return precautions.  Verbalized understanding and agreement to today's plan.  Low suspicion for cocaine at this time.  Dylan Carpenter was  evaluated in Emergency Department on 07/28/2018 for the symptoms described in the history of present illness. He was evaluated in the context of the global COVID-19 pandemic, which necessitated consideration that the patient might be at risk for infection with the SARS-CoV-2 virus that causes COVID-19. Institutional protocols and algorithms that pertain to the evaluation of patients at risk for COVID-19 are in a state of rapid change based on information released by regulatory bodies including the CDC and federal and state organizations. These policies and algorithms were followed during the patient's care in the ED.       Final Clinical Impressions(s) / ED Diagnoses   Final diagnoses:  Viral URI    ED Discharge Orders         Ordered    fluticasone (FLONASE) 50 MCG/ACT nasal spray  Daily     07/28/18 0853           Eyvonne Mechanic, PA-C 07/28/18 0076    Linwood Dibbles, MD 07/30/18 [phone]

## 2018-07-28 NOTE — ED Notes (Signed)
Patient verbalizes understanding of discharge instructions . Opportunity for questions and answers were provided . Armband removed by staff ,Pt discharged from ED. W/C  offered at D/C  and Declined W/C at D/C and was escorted to lobby by RN.  

## 2018-08-15 ENCOUNTER — Emergency Department (HOSPITAL_COMMUNITY)
Admission: EM | Admit: 2018-08-15 | Discharge: 2018-08-15 | Disposition: A | Discharge status: Home / Self Care | Payer: Medicaid Other | Attending: Emergency Medicine | Admitting: Emergency Medicine

## 2018-08-15 ENCOUNTER — Encounter (HOSPITAL_COMMUNITY): Payer: Self-pay | Admitting: Emergency Medicine

## 2018-08-15 ENCOUNTER — Other Ambulatory Visit: Payer: Self-pay

## 2018-08-15 DIAGNOSIS — Z8709 Personal history of other diseases of the respiratory system: Secondary | ICD-10-CM | POA: Insufficient documentation

## 2018-08-15 DIAGNOSIS — R0981 Nasal congestion: Secondary | ICD-10-CM | POA: Insufficient documentation

## 2018-08-15 DIAGNOSIS — R05 Cough: Secondary | ICD-10-CM | POA: Insufficient documentation

## 2018-08-15 MED ORDER — AMOXICILLIN 500 MG PO CAPS: 500.0000 mg | ORAL_CAPSULE | Freq: Three times a day (TID) | ORAL | 0 refills | Status: AC

## 2018-08-15 MED ORDER — DM-GUAIFENESIN ER 30-600 MG PO TB12: 1.0000 | ORAL_TABLET | Freq: Two times a day (BID) | ORAL | 0 refills | Status: DC | PRN

## 2018-08-15 NOTE — Discharge Instructions (Addendum)
Please take all of your antibiotics until finished!   You may develop abdominal discomfort or diarrhea from the antibiotic.  You may help offset this with probiotics which you can buy or get in yogurt. Do not eat  or take the probiotics until 2 hours after your antibiotic.   Continue to use Flonase as prescribed.  You can take zyrtec or allegra as needed for scratchy throat and nasal congestion.  You can also use Mucinex over-the-counter.  Drink plenty water and get plenty of rest.  You may also find a benefit from using a humidifier or steam shower.   Follow-up with primary care physician for evaluation of symptoms.  Return to the emergency department if any concerning signs or symptoms develop.

## 2018-08-15 NOTE — ED Provider Notes (Signed)
[facility] EMERGENCY DEPARTMENT Provider Note   CSN: [id number] Arrival date & time: 08/15/18  1105    History   Chief Complaint Chief Complaint  Patient presents with  . Nasal Congestion    HPI Dylan Carpenter is Carpenter 28 y.o. male with history of asthma and obesity presents for evaluation of ongoing persistent nasal congestion for 3 weeks.  He has been seen twice for this in the ED so far.  Reports mild scratchy throat in the mornings as well as coughing up Carpenter small amount of mucus.  He denies any shortness of breath, chest pains, abdominal pain, vomiting, fevers, ear pain.  He has been trying Flonase and over-the-counter NyQuil with some improvement.  He reports that Carpenter coworker tested positive for COVID-19.  He states that he last saw this coworker 2 weeks ago while he already had symptoms.     The history is provided by the patient.    Past Medical History:  Diagnosis Date  . Asthma   . Obesity     There are no active problems to display for this patient.   Past Surgical History:  Procedure Laterality Date  . DENTAL SURGERY          Home Medications    Prior to Admission medications   Medication Sig Start Date End Date Taking? Authorizing Provider  amoxicillin (AMOXIL) 500 MG capsule Take 1 capsule (500 mg total) by mouth 3 (three) times daily for 7 days. 08/15/18 08/22/18  Dylan Pitcher A, PA-C  dextromethorphan-guaiFENesin (MUCINEX DM) 30-600 MG 12hr tablet Take 1 tablet by mouth 2 (two) times daily as needed for cough. 08/15/18   Dylan Carpenter A, PA-C  fluticasone (FLONASE) 50 MCG/ACT nasal spray Place 1 spray into both nostrils daily. 07/28/18   Dylan Mechanic, PA-C    Family History No family history on file.  Social History Social History   Tobacco Use  . Smoking status: Never Smoker  . Smokeless tobacco: Never Used  Substance Use Topics  . Alcohol use: No  . Drug use: No     Allergies   Benadryl [diphenhydramine hcl (sleep)] and Kiwi  extract   Review of Systems Review of Systems  Constitutional: Negative for chills and fever.  HENT: Positive for congestion, sinus pressure and sore throat.   Respiratory: Negative for shortness of breath.   Cardiovascular: Negative for chest pain.  Gastrointestinal: Negative for abdominal pain, nausea and vomiting.     Physical Exam Updated Vital Signs BP 126/60 (BP Location: Right Arm)   Pulse 71   Temp 98.2 F (36.8 C) (Oral)   Resp 16   Ht 6' (1.829 m)   Wt (!) 140.6 kg   SpO2 98%   BMI 42.04 kg/m   Physical Exam Vitals signs and nursing note reviewed.  Constitutional:      General: He is not in acute distress.    Appearance: He is well-developed. He is obese.  HENT:     Head: Normocephalic and atraumatic.     Right Ear: Tympanic membrane, ear canal and external ear normal.     Left Ear: Tympanic membrane, ear canal and external ear normal.     Nose: Congestion and rhinorrhea present.     Comments: Sounds audibly congested, no maxillary or frontal sinus tenderness    Mouth/Throat:     Mouth: Mucous membranes are moist.     Pharynx: No oropharyngeal exudate or posterior oropharyngeal erythema.     Comments: No tonsillar hypertrophy, exudates, erythema,  sublingual abnormalities, or uvular deviation.  Tolerating secretions without difficulty.  Postnasal drip noted. Eyes:     General:        Right eye: No discharge.        Left eye: No discharge.     Extraocular Movements: Extraocular movements intact.     Conjunctiva/sclera: Conjunctivae normal.     Pupils: Pupils are equal, round, and reactive to light.  Neck:     Musculoskeletal: Normal range of motion and neck supple.     Vascular: No JVD.     Trachea: No tracheal deviation.  Cardiovascular:     Rate and Rhythm: Normal rate and regular rhythm.  Pulmonary:     Effort: Pulmonary effort is normal. No respiratory distress.     Breath sounds: Normal breath sounds. No wheezing.  Abdominal:     General: There  is no distension.     Palpations: Abdomen is soft.     Tenderness: There is no abdominal tenderness. There is no guarding or rebound.  Lymphadenopathy:     Cervical: No cervical adenopathy.  Skin:    General: Skin is warm.     Findings: No erythema.  Neurological:     Mental Status: He is alert.  Psychiatric:        Behavior: Behavior normal.      ED Treatments / Results  Labs (all labs ordered are listed, but only abnormal results are displayed) Labs Reviewed - No data to display  EKG None  Radiology No results found.  Procedures Procedures (including critical care time)  Medications Ordered in ED Medications - No data to display   Initial Impression / Assessment and Plan / ED Course  I have reviewed the triage vital signs and the nursing notes.  Pertinent labs & imaging results that were available during my care of the patient were reviewed by me and considered in my medical decision making (see chart for details).        Patient presenting with ongoing nasal congestion.  He is afebrile, vital signs are stable.  He is nontoxic in appearance.  His lungs are clear to auscultation bilaterally, no evidence of respiratory distress or wheezing.  I highly doubt that he has COVID-19 as he is afebrile and has been to 2 other times that he has been evaluated for similar complaints. Doubt pneumonia or PE. More likely he has Carpenter viral URI versus sinusitis.  Given the duration of his symptoms I think it is reasonable to prescribe Carpenter trial of antibiotics.  Also encouraged humidifier, allergy medications, continued use of Flonase, Mucinex.  Recommend follow-up with PCP for reevaluation of symptoms.  Discussed strict ED return precautions. Pt verbalized understanding of and agreement with plan and is safe for discharge home at this time.   Dylan Carpenter was evaluated in Emergency Department on 08/15/2018 for the symptoms described in the history of present illness. He was evaluated in the  context of the global COVID-19 pandemic, which necessitated consideration that the patient might be at risk for infection with the SARS-CoV-2 virus that causes COVID-19. Institutional protocols and algorithms that pertain to the evaluation of patients at risk for COVID-19 are in Carpenter state of rapid change based on information released by regulatory bodies including the CDC and federal and state organizations. These policies and algorithms were followed during the patient's care in the ED.  Final Clinical Impressions(s) / ED Diagnoses   Final diagnoses:  Nasal congestion    ED Discharge Orders  Ordered    amoxicillin (AMOXIL) 500 MG capsule  3 times daily     08/15/18 1147    dextromethorphan-guaiFENesin (MUCINEX DM) 30-600 MG 12hr tablet  2 times daily PRN     08/15/18 1147           Jeanie SewerFawze, Storm Sovine A, PA-C 08/15/18 1154    Charlynne PanderYao, David Hsienta, MD 08/15/18 Windell [id number]

## 2018-08-15 NOTE — ED Triage Notes (Signed)
Onset one 3-4 weeks ago developed nasal congestion and seen in the ED multiple times today will be the 3rd. States not feeling better. States 2 weeks ago a co-worker tested positive for [phone].

## 2018-10-05 ENCOUNTER — Encounter (HOSPITAL_COMMUNITY): Payer: Self-pay

## 2018-10-05 ENCOUNTER — Emergency Department (HOSPITAL_COMMUNITY)
Admission: EM | Admit: 2018-10-05 | Discharge: 2018-10-06 | Disposition: A | Discharge status: Home / Self Care | Payer: PRIVATE HEALTH INSURANCE | Attending: Emergency Medicine | Admitting: Emergency Medicine

## 2018-10-05 DIAGNOSIS — J45909 Unspecified asthma, uncomplicated: Secondary | ICD-10-CM | POA: Insufficient documentation

## 2018-10-05 DIAGNOSIS — Z79899 Other long term (current) drug therapy: Secondary | ICD-10-CM | POA: Insufficient documentation

## 2018-10-05 DIAGNOSIS — R112 Nausea with vomiting, unspecified: Secondary | ICD-10-CM | POA: Insufficient documentation

## 2018-10-05 DIAGNOSIS — N3000 Acute cystitis without hematuria: Secondary | ICD-10-CM | POA: Insufficient documentation

## 2018-10-05 DIAGNOSIS — R197 Diarrhea, unspecified: Secondary | ICD-10-CM

## 2018-10-05 LAB — CBC
HCT: 45.3 % (ref 39.0–52.0)
Hemoglobin: 14.6 g/dL (ref 13.0–17.0)
MCH: 29.4 pg (ref 26.0–34.0)
MCHC: 32.2 g/dL (ref 30.0–36.0)
MCV: 91.3 fL (ref 80.0–100.0)
Platelets: 331 10*3/uL (ref 150–400)
RBC: 4.96 MIL/uL (ref 4.22–5.81)
RDW: 12.9 % (ref 11.5–15.5)
WBC: 7.9 10*3/uL (ref 4.0–10.5)
nRBC: 0 % (ref 0.0–0.2)

## 2018-10-05 LAB — COMPREHENSIVE METABOLIC PANEL
ALT: 26 U/L (ref 0–44)
AST: 27 U/L (ref 15–41)
Albumin: 3.7 g/dL (ref 3.5–5.0)
Alkaline Phosphatase: 67 U/L (ref 38–126)
Anion gap: 9 (ref 5–15)
BUN: 12 mg/dL (ref 6–20)
CO2: 24 mmol/L (ref 22–32)
Calcium: 8.8 mg/dL — ABNORMAL LOW (ref 8.9–10.3)
Chloride: 105 mmol/L (ref 98–111)
Creatinine, Ser: 0.85 mg/dL (ref 0.61–1.24)
GFR calc Af Amer: >60 mL/min (ref >60)
GFR calc non Af Amer: >60 mL/min (ref >60)
Glucose, Bld: 134 mg/dL — ABNORMAL HIGH (ref 70–99)
Potassium: 4.2 mmol/L (ref 3.5–5.1)
Sodium: 138 mmol/L (ref 135–145)
Total Bilirubin: 0.6 mg/dL (ref 0.3–1.2)
Total Protein: 7.8 g/dL (ref 6.5–8.1)

## 2018-10-05 LAB — LIPASE, BLOOD: Lipase: 26 U/L (ref 11–51)

## 2018-10-05 MED ORDER — SODIUM CHLORIDE 0.9% FLUSH: 3.0000 mL | Freq: Once | INTRAVENOUS | Status: DC

## 2018-10-05 NOTE — ED Triage Notes (Signed)
Pt states that for the past three days he had been having generalized abd pain with vomiting, denies diarrhea or fevers.

## 2018-10-06 ENCOUNTER — Encounter (HOSPITAL_COMMUNITY): Payer: Self-pay | Admitting: Emergency Medicine

## 2018-10-06 LAB — URINALYSIS, ROUTINE W REFLEX MICROSCOPIC
Bacteria, UA: NONE SEEN
Bilirubin Urine: NEGATIVE
Glucose, UA: NEGATIVE mg/dL
Hgb urine dipstick: NEGATIVE
Ketones, ur: NEGATIVE mg/dL
Nitrite: NEGATIVE
Protein, ur: NEGATIVE mg/dL
Specific Gravity, Urine: 1.034 — ABNORMAL HIGH (ref 1.005–1.030)
pH: 5 (ref 5.0–8.0)

## 2018-10-06 MED ORDER — ALUM & MAG HYDROXIDE-SIMETH 200-200-20 MG/5ML PO SUSP
30.0000 mL | Freq: Once | ORAL | Status: AC
  Administered 2018-10-06: 30 mL via ORAL
  Filled 2018-10-06: qty 30

## 2018-10-06 MED ORDER — OMEPRAZOLE 20 MG PO CPDR: 20.0000 mg | DELAYED_RELEASE_CAPSULE | Freq: Every day | ORAL | 0 refills | Status: DC

## 2018-10-06 MED ORDER — NITROFURANTOIN MONOHYD MACRO 100 MG PO CAPS: 100.0000 mg | ORAL_CAPSULE | Freq: Two times a day (BID) | ORAL | 0 refills | Status: DC

## 2018-10-06 MED ORDER — NITROFURANTOIN MONOHYD MACRO 100 MG PO CAPS
100.0000 mg | ORAL_CAPSULE | Freq: Once | ORAL | Status: AC
  Administered 2018-10-06: 100 mg via ORAL
  Filled 2018-10-06: qty 1

## 2018-10-06 MED ORDER — ONDANSETRON 8 MG PO TBDP: ORAL_TABLET | ORAL | 0 refills | Status: DC

## 2018-10-06 MED ORDER — DICYCLOMINE HCL 10 MG/5ML PO SOLN
10.0000 mg | Freq: Once | ORAL | Status: AC
  Administered 2018-10-06: 10 mg via ORAL
  Filled 2018-10-06: qty 5

## 2018-10-06 MED ORDER — LIDOCAINE VISCOUS HCL 2 % MT SOLN
15.0000 mL | Freq: Once | OROMUCOSAL | Status: AC
  Administered 2018-10-06: 15 mL via ORAL
  Filled 2018-10-06: qty 15

## 2018-10-06 MED ORDER — ONDANSETRON 4 MG PO TBDP
8.0000 mg | ORAL_TABLET | Freq: Once | ORAL | Status: AC
  Administered 2018-10-06: 8 mg via ORAL
  Filled 2018-10-06: qty 2

## 2018-10-06 NOTE — ED Provider Notes (Signed)
[facility]   CSN: [id number] Arrival date & time: 10/05/18  2036     History   Chief Complaint Chief Complaint  Patient presents with  . Emesis    HPI Dylan Carpenter is a 28 y.o. male.     The history is provided by the patient.  Emesis Severity:  Mild Duration:  2 days Timing:  Rare Number of daily episodes:  3 Quality:  Stomach contents Able to tolerate:  Liquids Progression:  Unchanged Chronicity:  New Recent urination:  Normal Context: not post-tussive   Relieved by:  Nothing Exacerbated by: hamburgers and fries. Ineffective treatments:  None tried Associated symptoms: diarrhea   Associated symptoms: no abdominal pain, no cough, no fever and no sore throat   Diarrhea:    Quality:  Watery   Severity:  Mild   Timing:  Rare   Progression:  Unchanged Risk factors: sick contacts   Other family members with same, worsened by eating greasy foods.  Denies urinary symptoms.  No f/c/r.  No anosmia.  No cough no SOB.    Past Medical History:  Diagnosis Date  . Asthma   . Obesity     There are no active problems to display for this patient.   Past Surgical History:  Procedure Laterality Date  . DENTAL SURGERY          Home Medications    Prior to Admission medications   Medication Sig Start Date End Date Taking? Authorizing Provider  dextromethorphan-guaiFENesin (MUCINEX DM) 30-600 MG 12hr tablet Take 1 tablet by mouth 2 (two) times daily as needed for cough. 08/15/18   Fawze, Mina A, PA-C  fluticasone (FLONASE) 50 MCG/ACT nasal spray Place 1 spray into both nostrils daily. 07/28/18   Eyvonne MechanicHedges, Jeffrey, PA-C    Family History No family history on file.  Social History Social History   Tobacco Use  . Smoking status: Never Smoker  . Smokeless tobacco: Never Used  Substance Use Topics  . Alcohol use: No  . Drug use: No     Allergies   Benadryl [diphenhydramine hcl (sleep)] and Kiwi extract    Review of Systems Review of Systems  Constitutional: Negative for diaphoresis and fever.  HENT: Negative for sore throat.   Respiratory: Negative for cough and shortness of breath.   Cardiovascular: Negative for chest pain.  Gastrointestinal: Positive for diarrhea and vomiting. Negative for abdominal pain.  Genitourinary: Negative for dysuria and frequency.  All other systems reviewed and are negative.    Physical Exam Updated Vital Signs BP 128/75   Pulse 77   Temp 98.7 F (37.1 C) (Oral)   Resp 18   SpO2 100%   Physical Exam Vitals signs and nursing note reviewed.  Constitutional:      Appearance: He is obese. He is not ill-appearing.     Comments: Very well appearing, talking on phone  HENT:     Head: Normocephalic and atraumatic.     Nose: Nose normal.     Mouth/Throat:     Mouth: Mucous membranes are moist.  Eyes:     Conjunctiva/sclera: Conjunctivae normal.     Pupils: Pupils are equal, round, and reactive to light.  Neck:     Musculoskeletal: Normal range of motion and neck supple.  Cardiovascular:     Rate and Rhythm: Normal rate and regular rhythm.     Pulses: Normal pulses.     Heart sounds: Normal heart sounds.  Pulmonary:  Effort: Pulmonary effort is normal.     Breath sounds: Normal breath sounds.  Abdominal:     General: Abdomen is flat.     Tenderness: There is no abdominal tenderness. There is no guarding or rebound.     Comments: gassy  Musculoskeletal: Normal range of motion.  Skin:    General: Skin is warm and dry.     Capillary Refill: Capillary refill takes less than 2 seconds.  Neurological:     General: No focal deficit present.     Mental Status: He is alert and oriented to person, place, and time.     Deep Tendon Reflexes: Reflexes normal.  Psychiatric:        Mood and Affect: Mood normal.        Behavior: Behavior normal.      ED Treatments / Results  Labs (all labs ordered are listed, but only abnormal results are  displayed) Results for orders placed or performed during the hospital encounter of 10/05/18  Lipase, blood  Result Value Ref Range   Lipase 26 11 - 51 U/L  Comprehensive metabolic panel  Result Value Ref Range   Sodium 138 135 - 145 mmol/L   Potassium 4.2 3.5 - 5.1 mmol/L   Chloride 105 98 - 111 mmol/L   CO2 24 22 - 32 mmol/L   Glucose, Bld 134 (H) 70 - 99 mg/dL   BUN 12 6 - 20 mg/dL   Creatinine, Ser 0.85 0.61 - 1.24 mg/dL   Calcium 8.8 (L) 8.9 - 10.3 mg/dL   Total Protein 7.8 6.5 - 8.1 g/dL   Albumin 3.7 3.5 - 5.0 g/dL   AST 27 15 - 41 U/L   ALT 26 0 - 44 U/L   Alkaline Phosphatase 67 38 - 126 U/L   Total Bilirubin 0.6 0.3 - 1.2 mg/dL   GFR calc non Af Amer >60 >60 mL/min   GFR calc Af Amer >60 >60 mL/min   Anion gap 9 5 - 15  CBC  Result Value Ref Range   WBC 7.9 4.0 - 10.5 K/uL   RBC 4.96 4.22 - 5.81 MIL/uL   Hemoglobin 14.6 13.0 - 17.0 g/dL   HCT 45.3 39.0 - 52.0 %   MCV 91.3 80.0 - 100.0 fL   MCH 29.4 26.0 - 34.0 pg   MCHC 32.2 30.0 - 36.0 g/dL   RDW 12.9 11.5 - 15.5 %   Platelets 331 150 - 400 K/uL   nRBC 0.0 0.0 - 0.2 %  Urinalysis, Routine w reflex microscopic  Result Value Ref Range   Color, Urine YELLOW YELLOW   APPearance CLEAR CLEAR   Specific Gravity, Urine 1.034 (H) 1.005 - 1.030   pH 5.0 5.0 - 8.0   Glucose, UA NEGATIVE NEGATIVE mg/dL   Hgb urine dipstick NEGATIVE NEGATIVE   Bilirubin Urine NEGATIVE NEGATIVE   Ketones, ur NEGATIVE NEGATIVE mg/dL   Protein, ur NEGATIVE NEGATIVE mg/dL   Nitrite NEGATIVE NEGATIVE   Leukocytes,Ua MODERATE (A) NEGATIVE   RBC / HPF 0-5 0 - 5 RBC/hpf   WBC, UA 21-50 0 - 5 WBC/hpf   Bacteria, UA NONE SEEN NONE SEEN   Squamous Epithelial / LPF 0-5 0 - 5   Mucus PRESENT    No results found.  EKG    Radiology No results found.  Procedures Procedures (including critical care time)  Medications Ordered in ED Medications  sodium chloride flush (NS) 0.9 % injection 3 mL (has no administration in time range)  nitrofurantoin (macrocrystal-monohydrate) (MACROBID) capsule 100 mg (has no administration in time range)  ondansetron (ZOFRAN-ODT) disintegrating tablet 8 mg (8 mg Oral Given 10/06/18 0024)  alum & mag hydroxide-simeth (MAALOX/MYLANTA) 200-200-20 MG/5ML suspension 30 mL (30 mLs Oral Given 10/06/18 0024)    And  lidocaine (XYLOCAINE) 2 % viscous mouth solution 15 mL (15 mLs Oral Given 10/06/18 0024)  dicyclomine (BENTYL) 10 MG/5ML syrup 10 mg (10 mg Oral Given 10/06/18 0025)    PO challenged successfully.  Bland diet instructions given.  Will treat for UTI.  Exam and vitals are benign and reassuring no indication for imaging at this time  Final Clinical Impressions(s) / ED Diagnoses   Return for intractable cough, coughing up blood,fevers >100.4 unrelieved by medication, shortness of breath, intractable vomiting, chest pain, shortness of breath, weakness,numbness, changes in speech, facial asymmetry,abdominal pain, passing out,Inability to tolerate liquids or food, cough, altered mental status or any concerns. No signs of systemic illness or infection. The patient is nontoxic-appearing on exam and vital signs are within normal limits.   I have reviewed the triage vital signs and the nursing notes. Pertinent labs &imaging results that were available during my care of the patient were reviewed by me and considered in my medical decision making (see chart for details).  After history, exam, and medical workup I feel the patient has been appropriately medically screened and is safe for discharge home. Pertinent diagnoses were discussed with the patient. Patient was given return precautions   Teanna Elem, MD 10/06/18 [id number]

## 2018-10-06 NOTE — ED Notes (Signed)
Oral trial complete, pt tolerating gingerale without difficulty

## 2018-10-27 ENCOUNTER — Emergency Department (HOSPITAL_COMMUNITY)
Admission: EM | Admit: 2018-10-27 | Discharge: 2018-10-27 | Disposition: A | Discharge status: Home / Self Care | Payer: Medicaid Other | Attending: Emergency Medicine | Admitting: Emergency Medicine

## 2018-10-27 ENCOUNTER — Other Ambulatory Visit: Payer: Self-pay

## 2018-10-27 DIAGNOSIS — R05 Cough: Secondary | ICD-10-CM

## 2018-10-27 DIAGNOSIS — J45909 Unspecified asthma, uncomplicated: Secondary | ICD-10-CM | POA: Insufficient documentation

## 2018-10-27 DIAGNOSIS — Z79899 Other long term (current) drug therapy: Secondary | ICD-10-CM | POA: Insufficient documentation

## 2018-10-27 DIAGNOSIS — R059 Cough, unspecified: Secondary | ICD-10-CM

## 2018-10-27 MED ORDER — BENZONATATE 100 MG PO CAPS: 100.0000 mg | ORAL_CAPSULE | Freq: Three times a day (TID) | ORAL | 0 refills | Status: DC

## 2018-10-27 NOTE — ED Provider Notes (Signed)
[facility] EMERGENCY DEPARTMENT Provider Note   CSN: [id number] Arrival date & time: 10/27/18  1337    History   Chief Complaint Chief Complaint  Patient presents with  . Cough    HPI Dylan Carpenter is a 28 y.o. male.     HPI   28 year old male presents today with complaints of cough and runny nose.  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  Patient notes that he had a cough today and was sent to the emergency room for a work note by his workplace.  He notes that he feels otherwise well, no fever or shortness of breath.  They noted this morning with a temporal temperature that was elevated but he cannot provide me with any objective reading.  He notes this feels similar to previous infections he is got from working in the cooler.  Past Medical History:  Diagnosis Date  . Asthma   . Obesity     There are no active problems to display for this patient.   Past Surgical History:  Procedure Laterality Date  . DENTAL SURGERY          Home Medications    Prior to Admission medications   Medication Sig Start Date End Date Taking? Authorizing Provider  benzonatate (TESSALON) 100 MG capsule Take 1 capsule (100 mg total) by mouth every 8 (eight) hours. 10/27/18   Iolani Twilley, Dellis Filbert, PA-C  dextromethorphan-guaiFENesin (MUCINEX DM) 30-600 MG 12hr tablet Take 1 tablet by mouth 2 (two) times daily as needed for cough. 08/15/18   Fawze, Mina A, PA-C  fluticasone (FLONASE) 50 MCG/ACT nasal spray Place 1 spray into both nostrils daily. 07/28/18   Anjelita Sheahan, Dellis Filbert, PA-C  nitrofurantoin, macrocrystal-monohydrate, (MACROBID) 100 MG capsule Take 1 capsule (100 mg total) by mouth 2 (two) times daily. X 7 days 10/06/18   Palumbo, April, MD  omeprazole (PRILOSEC) 20 MG capsule Take 1 capsule (20 mg total) by mouth daily. 10/06/18   Palumbo, April, MD  ondansetron (ZOFRAN ODT) 8 MG disintegrating tablet 8mg  ODT q8 hours prn nausea 10/06/18    Palumbo, April, MD    Family History No family history on file.  Social History Social History   Tobacco Use  . Smoking status: Never Smoker  . Smokeless tobacco: Never Used  Substance Use Topics  . Alcohol use: No  . Drug use: No     Allergies   Benadryl [diphenhydramine hcl (sleep)] and Kiwi extract   Review of Systems Review of Systems  All other systems reviewed and are negative.    Physical Exam Updated Vital Signs BP 127/69   Pulse 75   Temp 98.6 F (37 C) (Oral)   Resp 18   SpO2 97%   Physical Exam Vitals signs and nursing note reviewed.  Constitutional:      Appearance: He is well-developed.  HENT:     Head: Normocephalic and atraumatic.     Comments: Oropharynx is clear no swelling edema or erythema Eyes:     General: No scleral icterus.       Right eye: No discharge.        Left eye: No discharge.     Conjunctiva/sclera: Conjunctivae normal.     Pupils: Pupils are equal, round, and reactive to light.  Neck:     Musculoskeletal: Normal range of motion.     Vascular: No JVD.     Trachea: No tracheal deviation.  Pulmonary:     Effort: Pulmonary  effort is normal. No respiratory distress.     Breath sounds: Normal breath sounds. No stridor. No wheezing, rhonchi or rales.  Neurological:     Mental Status: He is alert and oriented to person, place, and time.     Coordination: Coordination normal.  Psychiatric:        Behavior: Behavior normal.        Thought Content: Thought content normal.        Judgment: Judgment normal.      ED Treatments / Results  Labs (all labs ordered are listed, but only abnormal results are displayed) Labs Reviewed - No data to display  EKG None  Radiology No results found.  Procedures Procedures (including critical care time)  Medications Ordered in ED Medications - No data to display   Initial Impression / Assessment and Plan / ED Course  I have reviewed the triage vital signs and the nursing notes.   Pertinent labs & imaging results that were available during my care of the patient were reviewed by me and considered in my medical decision making (see chart for details).        28 year old male presents today for a work note.  Patient reports a minor cough which he attributes to working in the cooler.  He has had similar presentations previously.  Patient admittedly notes that he does not feel bad.  I have low suspicion for significant respiratory illness in this patient.  I discussed the need for ongoing evaluation and management as an outpatient with primary care and that the emergency room is not the appropriate place to come for work notes.  Patient given strict return precautions, he verbalized understanding and agreement to today's plan.  Final Clinical Impressions(s) / ED Diagnoses   Final diagnoses:  Cough    ED Discharge Orders         Ordered    benzonatate (TESSALON) 100 MG capsule  Every 8 hours     10/27/18 1451           Eyvonne MechanicHedges, Maleeka Sabatino, PA-C 10/27/18 1454    Gerhard MunchLockwood, Robert, MD 10/28/18 0900

## 2018-10-27 NOTE — ED Triage Notes (Signed)
Pt c/o cough that began a couple of days ago ; denies any sob ; pt states they told him at work his temp was a low grade fever

## 2018-10-27 NOTE — ED Notes (Signed)
Patient verbalizes understanding of discharge instructions. Opportunity for questioning and answering were provided, patient discharged from ED. 

## 2018-10-27 NOTE — Discharge Instructions (Addendum)
Please read attached information. If you experience any new or worsening signs or symptoms please return to the emergency room for evaluation. Please follow-up with your primary care provider or specialist as discussed. Please use medication prescribed only as directed and discontinue taking if you have any concerning signs or symptoms.    As we discussed the emergency room is not the appropriate place to come for work notes.  Please discuss this with your employer and arrange for care within your workplace health clinic.

## 2019-02-18 ENCOUNTER — Other Ambulatory Visit: Payer: Self-pay

## 2019-02-18 ENCOUNTER — Emergency Department (HOSPITAL_COMMUNITY): Payer: Self-pay

## 2019-02-18 ENCOUNTER — Emergency Department (HOSPITAL_COMMUNITY)
Admission: EM | Admit: 2019-02-18 | Discharge: 2019-02-18 | Disposition: A | Discharge status: Home / Self Care | Payer: Self-pay | Attending: Emergency Medicine | Admitting: Emergency Medicine

## 2019-02-18 DIAGNOSIS — K6289 Other specified diseases of anus and rectum: Secondary | ICD-10-CM | POA: Insufficient documentation

## 2019-02-18 NOTE — Discharge Instructions (Signed)
You have been seen today for bleeding under your scrotum. Please read and follow all provided instructions. Return to the emergency room for worsening condition or new concerning symptoms.    Ultrasound today did not show any signs of an abscess or infection.  1. Medications:  Take Tylenol or ibuprofen as needed for pain.  Please take as prescribed.  Continue usual home medications  2. Treatment: rest, drink plenty of fluids.  Apply warm compress to the area if needed for pain.   3. Follow Up: Please follow up with your primary doctor in 2-5 days for discussion of your diagnoses and further evaluation after today's visit; Call today to arrange your follow up.  If you do not have a primary care doctor use the resource guide provided to find one;  -Also included the information for [facility].  You can call the number to schedule a follow-up appointment  ?

## 2019-02-18 NOTE — ED Triage Notes (Signed)
Raw area on bottom of scrotum/perineal area. No bleeding at this time.

## 2019-02-18 NOTE — ED Provider Notes (Signed)
[facility] EMERGENCY DEPARTMENT Provider Note   CSN: [id number] Arrival date & time: 02/18/19  1442     History   Chief Complaint No chief complaint on file.   HPI Dylan Carpenter is a 28 y.o. male medical history significant for asthma presents emergency department today with chief complaint of bleeding under scrotum.  Patient states earlier today he had a bowel movement and when he wiped he noticed bright red blood on the toilet paper.  He felt his scrotum and noticed a small bump.  He states the bump is only tender to palpation. No medications for symptoms prior to arrival. He denies history of STI. He is sexually active with one male partner without protection. Denies fever, chills, abdominal pain, nausea, vomiting, gross hematuria, penile discharge, rash, lesions, diarrhea, scrotal swelling, testicular pain, fatigue, weight loss. History provided by patient with additional history obtained from chart review.      Past Medical History:  Diagnosis Date  . Asthma   . Obesity     There are no active problems to display for this patient.   Past Surgical History:  Procedure Laterality Date  . DENTAL SURGERY          Home Medications    Prior to Admission medications   Medication Sig Start Date End Date Taking? Authorizing Provider  benzonatate (TESSALON) 100 MG capsule Take 1 capsule (100 mg total) by mouth every 8 (eight) hours. 10/27/18   Hedges, Tinnie Gens, PA-C  dextromethorphan-guaiFENesin (MUCINEX DM) 30-600 MG 12hr tablet Take 1 tablet by mouth 2 (two) times daily as needed for cough. 08/15/18   Fawze, Mina A, PA-C  fluticasone (FLONASE) 50 MCG/ACT nasal spray Place 1 spray into both nostrils daily. 07/28/18   Hedges, Tinnie Gens, PA-C  nitrofurantoin, macrocrystal-monohydrate, (MACROBID) 100 MG capsule Take 1 capsule (100 mg total) by mouth 2 (two) times daily. X 7 days 10/06/18   Palumbo, April, MD  omeprazole (PRILOSEC) 20 MG capsule Take 1 capsule (20 mg  total) by mouth daily. 10/06/18   Palumbo, April, MD  ondansetron (ZOFRAN ODT) 8 MG disintegrating tablet 8mg  ODT q8 hours prn nausea 10/06/18   Palumbo, April, MD    Family History No family history on file.  Social History Social History   Tobacco Use  . Smoking status: Never Smoker  . Smokeless tobacco: Never Used  Substance Use Topics  . Alcohol use: No  . Drug use: No     Allergies   Benadryl [diphenhydramine hcl (sleep)] and Kiwi extract   Review of Systems Review of Systems  Constitutional: Negative for chills, fatigue, fever and unexpected weight change.  Gastrointestinal: Negative for abdominal pain, anal bleeding, diarrhea, nausea, rectal pain and vomiting.  Genitourinary: Negative for discharge, flank pain, frequency, genital sores, hematuria, penile pain, penile swelling, scrotal swelling and testicular pain.  Skin: Positive for wound.     Physical Exam Updated Vital Signs BP 138/69   Pulse 68   Temp 99.4 F (37.4 C) (Oral)   Resp 16   SpO2 100%   Physical Exam Vitals signs and nursing note reviewed.  Constitutional:      Appearance: He is well-developed. He is not ill-appearing or toxic-appearing.  HENT:     Head: Normocephalic and atraumatic.     Nose: Nose normal.  Eyes:     General: No scleral icterus.       Right eye: No discharge.        Left eye: No discharge.     Conjunctiva/sclera:  Conjunctivae normal.  Neck:     Musculoskeletal: Normal range of motion.     Vascular: No JVD.  Cardiovascular:     Rate and Rhythm: Normal rate and regular rhythm.     Pulses: Normal pulses.     Heart sounds: Normal heart sounds.  Pulmonary:     Effort: Pulmonary effort is normal.     Breath sounds: Normal breath sounds.  Abdominal:     General: There is no distension.     Tenderness: There is no right CVA tenderness or left CVA tenderness.     Comments: No abdominal tenderness on exam. No guarding or rigidity, no peritoneal signs.  Genitourinary:     Penis: Circumcised.        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 No discharge or urethritis noted. No signs of sores or lesions or erythema on the penis or testicles. The penis and testicles are nontender. No testicular masses or swelling. No scrotal swelling. No signs of any inguinal hernias. Cremaster reflex present bilaterally.   Musculoskeletal: Normal range of motion.  Skin:    General: Skin is warm and dry.     Findings: No lesion or rash.  Neurological:     Mental Status: He is oriented to person, place, and time.     GCS: GCS eye subscore is 4. GCS verbal subscore is 5. GCS motor subscore is 6.     Comments: Fluent speech, no facial droop.  Psychiatric:        Mood and Affect: Mood is anxious.        Behavior: Behavior normal.      ED Treatments / Results  Labs (all labs ordered are listed, but only abnormal results are displayed) Labs Reviewed - No data to display  EKG None  Radiology [place] Scrotum  Result Date: 02/18/2019 CLINICAL DATA:  Concern for scrotal abscess EXAM: SCROTAL ULTRASOUND DOPPLER ULTRASOUND OF THE TESTICLES TECHNIQUE: Complete ultrasound examination of the testicles, epididymis, and other scrotal structures was performed. Color and spectral Doppler ultrasound were also utilized to evaluate blood flow to the testicles. COMPARISON:  None. FINDINGS: Right testicle Measurements: 3.9 x 1.9 x 2.3 cm. No mass or microlithiasis visualized. Left testicle Measurements: 3.8 x 2.3 x 2.7 cm. No mass or microlithiasis visualized. Right epididymis:  Normal in size and appearance. Left epididymis:  Normal in size and appearance. Hydrocele:  Trace left hydrocele.  No right hydrocele. Varicocele:  Mild left varicocele.  No right varicocele Pulsed Doppler interrogation of both testes demonstrates normal low resistance arterial and venous waveforms bilaterally. Other: There is diffuse scrotal and perineal skin thickening without discernible abscess or  collection. IMPRESSION: No evidence of testicular mass or torsion. Diffuse scrotal and perineal skin thickening. No visible soft tissue collection or discernible abscess. Trace left hydrocele and small left varicocele. Electronically Signed   By: Kreg ShropshirePrice  DeHay M.D.   On: 02/18/2019 18:47    Procedures Procedures (including critical care time)  Medications Ordered in ED Medications - No data to display   Initial Impression / Assessment and Plan / ED Course  I have reviewed the triage vital signs and the nursing notes.  Pertinent labs & imaging results that were available during my care of the patient were reviewed by me and considered in my medical decision making (see chart for details).  Patient seen and examined. Young healthy male is well appearing, in no acute distress. He appears anxious. Vitals are within normal range, febrile. Temperature in triage 99.4,  when I rechecked it is is 98.5 oral.  No abdominal pain on exam. No rash or lesions consistent with syphilis. GU exam chaperoned. No abscess felt, no mass, no lesions, no bleeding on or near scrotum. There is an area of tenderness underneath the scrotum that appears to have skin irritation. No testicular tenderness on exam. US scrotum without acute findings.Suspect the blood patient noticed earlier is possibly from skin irritation.  Engaged in shared decision making regarding lab work, pt declines to have labs drawn and cannot provide urine sample as he used the bathroom just prior to arrival. Given patient's reassuring exam, vitals, and negative [place] I feel patient is stable to be discharged home without further workup.  The patient appears reasonably screened and/or stabilized for discharge and I doubt any other medical condition or other [facility] requiring further screening, evaluation, or treatment in the ED at this time prior to discharge. The patient is safe for discharge with strict return precautions discussed. Recommend pcp follow up if  symptoms persist.   Portions of this note were generated with Dragon dictation software. Dictation errors may occur despite best attempts at proofreading.   Final Clinical Impressions(s) / ED Diagnoses   Final diagnoses:  Irritation of perirectal skin    ED Discharge Orders    None       Cherre Robins, PA-C 02/19/19 0050    Isla Pence, MD 02/22/19 1506

## 2019-03-04 ENCOUNTER — Emergency Department (HOSPITAL_COMMUNITY)
Admission: EM | Admit: 2019-03-04 | Discharge: 2019-03-04 | Disposition: A | Discharge status: Home / Self Care | Payer: Medicaid Other | Attending: Emergency Medicine | Admitting: Emergency Medicine

## 2019-03-04 ENCOUNTER — Encounter (HOSPITAL_COMMUNITY): Payer: Self-pay | Admitting: *Deleted

## 2019-03-04 ENCOUNTER — Other Ambulatory Visit: Payer: Self-pay

## 2019-03-04 DIAGNOSIS — R300 Dysuria: Secondary | ICD-10-CM

## 2019-03-04 DIAGNOSIS — Z202 Contact with and (suspected) exposure to infections with a predominantly sexual mode of transmission: Secondary | ICD-10-CM | POA: Insufficient documentation

## 2019-03-04 DIAGNOSIS — R350 Frequency of micturition: Secondary | ICD-10-CM | POA: Insufficient documentation

## 2019-03-04 DIAGNOSIS — Z6841 Body Mass Index (BMI) 40.0 and over, adult: Secondary | ICD-10-CM | POA: Insufficient documentation

## 2019-03-04 DIAGNOSIS — E669 Obesity, unspecified: Secondary | ICD-10-CM | POA: Insufficient documentation

## 2019-03-04 DIAGNOSIS — Z8709 Personal history of other diseases of the respiratory system: Secondary | ICD-10-CM | POA: Insufficient documentation

## 2019-03-04 DIAGNOSIS — Z79899 Other long term (current) drug therapy: Secondary | ICD-10-CM | POA: Insufficient documentation

## 2019-03-04 LAB — URINALYSIS, ROUTINE W REFLEX MICROSCOPIC
Bilirubin Urine: NEGATIVE
Glucose, UA: NEGATIVE mg/dL
Hgb urine dipstick: NEGATIVE
Ketones, ur: NEGATIVE mg/dL
Nitrite: NEGATIVE
Protein, ur: NEGATIVE mg/dL
Specific Gravity, Urine: 1.025 (ref 1.005–1.030)
WBC, UA: >50 WBC/hpf — ABNORMAL HIGH (ref 0–5)
pH: 5 (ref 5.0–8.0)

## 2019-03-04 LAB — COMPREHENSIVE METABOLIC PANEL
ALT: 20 U/L (ref 0–44)
AST: 25 U/L (ref 15–41)
Albumin: 4 g/dL (ref 3.5–5.0)
Alkaline Phosphatase: 58 U/L (ref 38–126)
Anion gap: 8 (ref 5–15)
BUN: 12 mg/dL (ref 6–20)
CO2: 26 mmol/L (ref 22–32)
Calcium: 9 mg/dL (ref 8.9–10.3)
Chloride: 102 mmol/L (ref 98–111)
Creatinine, Ser: 0.85 mg/dL (ref 0.61–1.24)
GFR calc Af Amer: >60 mL/min (ref >60)
GFR calc non Af Amer: >60 mL/min (ref >60)
Glucose, Bld: 91 mg/dL (ref 70–99)
Potassium: 4 mmol/L (ref 3.5–5.1)
Sodium: 136 mmol/L (ref 135–145)
Total Bilirubin: 0.6 mg/dL (ref 0.3–1.2)
Total Protein: 8 g/dL (ref 6.5–8.1)

## 2019-03-04 LAB — CBC
HCT: 46.3 % (ref 39.0–52.0)
Hemoglobin: 14.7 g/dL (ref 13.0–17.0)
MCH: 29.3 pg (ref 26.0–34.0)
MCHC: 31.7 g/dL (ref 30.0–36.0)
MCV: 92.2 fL (ref 80.0–100.0)
Platelets: 354 10*3/uL (ref 150–400)
RBC: 5.02 MIL/uL (ref 4.22–5.81)
RDW: 13.1 % (ref 11.5–15.5)
WBC: 7.3 10*3/uL (ref 4.0–10.5)
nRBC: 0 % (ref 0.0–0.2)

## 2019-03-04 LAB — HIV ANTIBODY (ROUTINE TESTING W REFLEX): HIV Screen 4th Generation wRfx: NONREACTIVE

## 2019-03-04 MED ORDER — CEPHALEXIN 500 MG PO CAPS: 500.0000 mg | ORAL_CAPSULE | Freq: Four times a day (QID) | ORAL | 0 refills | Status: DC

## 2019-03-04 MED ORDER — AZITHROMYCIN 250 MG PO TABS
1000.0000 mg | ORAL_TABLET | Freq: Once | ORAL | Status: AC
  Administered 2019-03-04: 22:00:00 1000 mg via ORAL
  Filled 2019-03-04: qty 4

## 2019-03-04 MED ORDER — CEFTRIAXONE SODIUM 250 MG IJ SOLR
250.0000 mg | Freq: Once | INTRAMUSCULAR | Status: AC
  Administered 2019-03-04: 250 mg via INTRAMUSCULAR
  Filled 2019-03-04: qty 250

## 2019-03-04 NOTE — ED Triage Notes (Signed)
The pt is c/o painful urination for one week no temp

## 2019-03-04 NOTE — Discharge Instructions (Addendum)
You were seen in the emergency department today for urinary symptoms.  Your urine appeared infected.  The other labs we tested were normal.  We have also tested you for gonorrhea, chlamydia, HIV, and syphilis.  We have treated you in the emergency department for gonorrhea and chlamydia with antibiotics here today.  We are sending you home with Keflex, an antibiotic, to cover for a possible UTI, if your urine culture is normal we will call you to tell you to discontinue the antibiotics, but please take this in the meantime.    We will also call you if your gonorrhea, chlamydia, HIV, or syphilis tests returned positive, if positive you will need to inform all sexual partners, it is important that you do not have intercourse of any kind for 1 week following treatment, we also recommend getting retested in 1 week should your results return positive.   Please follow-up with the health department or your primary care provider in 1 week for repeat testing if necessary and reevaluation.  Return to the emergency department for new or worsening symptoms including but not limited to testicular pain/swelling, pain with bowel movements, fever, vomiting, abdominal pain, or any other concerns.

## 2019-03-04 NOTE — ED Provider Notes (Signed)
[facility] EMERGENCY DEPARTMENT Provider Note   CSN: [id number] Arrival date & time: 03/04/19  1414     History   Chief Complaint Chief Complaint  Patient presents with  . Dysuria    HPI Dylan Carpenter is a 28 y.o. male with a hx of asthma & obesity who presents to the emergency department with urinary symptoms over the past 1 week.  Patient states he has been experiencing dysuria, urgency, & frequency which is progressively worsening. He had 1 episode of white penile discharge associated with his sxs. Other than urination no alleviating/aggravating factors. Recently became sexually active with a new male partner without protection and is concerned about potential STD, no known exposures. Denies fever, chills, N/V, abdominal pain, rectal pain w/ BM, or testicular pain/swelling.     HPI  Past Medical History:  Diagnosis Date  . Asthma   . Obesity     There are no active problems to display for this patient.   Past Surgical History:  Procedure Laterality Date  . DENTAL SURGERY          Home Medications    Prior to Admission medications   Medication Sig Start Date End Date Taking? Authorizing Provider  benzonatate (TESSALON) 100 MG capsule Take 1 capsule (100 mg total) by mouth every 8 (eight) hours. 10/27/18   Hedges, Dellis Filbert, PA-C  dextromethorphan-guaiFENesin (MUCINEX DM) 30-600 MG 12hr tablet Take 1 tablet by mouth 2 (two) times daily as needed for cough. 08/15/18   Fawze, Mina A, PA-C  fluticasone (FLONASE) 50 MCG/ACT nasal spray Place 1 spray into both nostrils daily. 07/28/18   Hedges, Dellis Filbert, PA-C  nitrofurantoin, macrocrystal-monohydrate, (MACROBID) 100 MG capsule Take 1 capsule (100 mg total) by mouth 2 (two) times daily. X 7 days 10/06/18   Palumbo, April, MD  omeprazole (PRILOSEC) 20 MG capsule Take 1 capsule (20 mg total) by mouth daily. 10/06/18   Palumbo, April, MD  ondansetron (ZOFRAN ODT) 8 MG disintegrating tablet 8mg  ODT q8 hours prn  nausea 10/06/18   Palumbo, April, MD    Family History No family history on file.  Social History Social History   Tobacco Use  . Smoking status: Never Smoker  . Smokeless tobacco: Never Used  Substance Use Topics  . Alcohol use: No  . Drug use: No     Allergies   Benadryl [diphenhydramine hcl (sleep)] and Kiwi extract   Review of Systems Review of Systems  Constitutional: Negative for chills and fever.  Respiratory: Negative for shortness of breath.   Cardiovascular: Negative for chest pain.  Gastrointestinal: Negative for abdominal pain, nausea and vomiting.  Genitourinary: Positive for discharge, dysuria, frequency and urgency. Negative for flank pain, genital sores, penile swelling, scrotal swelling and testicular pain.  All other systems reviewed and are negative.  Physical Exam Updated Vital Signs BP 130/86 (BP Location: Right Arm)   Pulse 60   Temp 98.8 F (37.1 C) (Oral)   Resp 17   Ht 6' (1.829 m)   Wt 136.1 kg   SpO2 98%   BMI 40.69 kg/m   Physical Exam Vitals signs and nursing note reviewed. Exam conducted with a chaperone present.  Constitutional:      General: He is not in acute distress.    Appearance: He is well-developed. He is not toxic-appearing.  HENT:     Head: Normocephalic and atraumatic.  Eyes:     General:        Right eye: No discharge.  Left eye: No discharge.     Conjunctiva/sclera: Conjunctivae normal.  Neck:     Musculoskeletal: Neck supple.  Cardiovascular:     Rate and Rhythm: Normal rate and regular rhythm.  Pulmonary:     Effort: Pulmonary effort is normal. No respiratory distress.     Breath sounds: Normal breath sounds. No wheezing, rhonchi or rales.  Abdominal:     General: There is no distension.     Palpations: Abdomen is soft.     Tenderness: There is no abdominal tenderness. There is no right CVA tenderness, left CVA tenderness, guarding or rebound.  Genitourinary:    Penis: Circumcised. No erythema,  discharge or lesions.      Scrotum/Testes: Normal.        Right: Tenderness or swelling not present.        Left: Tenderness or swelling not present.     Epididymis:     Right: No tenderness.     Left: No tenderness.     Comments: PA-S present as chaperone.  Skin:    General: Skin is warm and dry.     Findings: No rash.  Neurological:     Mental Status: He is alert.     Comments: Clear speech.   Psychiatric:        Behavior: Behavior normal.    ED Treatments / Results  Labs (all labs ordered are listed, but only abnormal results are displayed) Labs Reviewed  URINALYSIS, ROUTINE W REFLEX MICROSCOPIC - Abnormal; Notable for the following components:      Result Value   APPearance CLOUDY (*)    Leukocytes,Ua LARGE (*)    WBC, UA >50 (*)    Bacteria, UA RARE (*)    All other components within normal limits  COMPREHENSIVE METABOLIC PANEL  CBC    EKG None  Radiology No results found.  Procedures Procedures (including critical care time)  Medications Ordered in ED Medications  cefTRIAXone (ROCEPHIN) injection 250 mg (250 mg Intramuscular Given 03/04/19 2207)  azithromycin (ZITHROMAX) tablet 1,000 mg (1,000 mg Oral Given 03/04/19 2205)    Initial Impression / Assessment and Plan / ED Course  I have reviewed the triage vital signs and the nursing notes.  Pertinent labs & imaging results that were available during my care of the patient were reviewed by me and considered in my medical decision making (see chart for details).   Patient presents to the ED w/ complaints of urinary sxs & penile discharge w/ recent new sexual partner. Patient is nontoxic appearing, no apparent distress, vitals WNL. Labs per triage reviewed:   CBC: No anemia/leukocytosis.  BMP: Renal function WNL. No electrolyte derangement.  UA: Concerning for infection. No trich.   No H&P components to raise concern for epididymitis, orchitis, or prostatitis.  GC/chlamydia/HIV/RPR pending. Tx for  GC/chlamydia w/ rocephin/azithro in the ED.  Suspect most likely STI w/ history, but will also cover for keflex for UTI w/ urine culture pending.  Discussed need for abstinence to prevent re-infection, need to inform all sexual partners if positive, & protection with the patient. I discussed results, treatment plan, need for follow-up, and return precautions with the patient. Provided opportunity for questions, patient confirmed understanding and is in agreement with plan.   Final Clinical Impressions(s) / ED Diagnoses   Final diagnoses:  Dysuria    ED Discharge Orders         Ordered    cephALEXin (KEFLEX) 500 MG capsule  4 times daily     03/04/19  [address]           Desmond Lope 03/04/19 2227    Little, Ambrose Finland, MD 03/04/19 2259

## 2019-03-04 NOTE — ED Notes (Signed)
Patient verbalizes understanding of discharge instructions. Opportunity for questioning and answers were provided. Armband removed by staff, pt discharged from ED. Pt. ambulatory and discharged home.  

## 2019-03-05 LAB — RPR: RPR Ser Ql: NONREACTIVE

## 2019-03-07 LAB — URINE CULTURE: Culture: >=100000 — AB

## 2019-03-08 ENCOUNTER — Telehealth: Payer: Self-pay | Admitting: *Deleted

## 2019-03-08 NOTE — Telephone Encounter (Signed)
Post ED Visit - Positive Culture Follow-up  Culture report reviewed by antimicrobial stewardship pharmacist: [facility] Team []  Elenor Quinones, Pharm.D. []  Heide Guile, Pharm.D., BCPS AQ-ID []  Parks Neptune, Pharm.D., BCPS []  Alycia Rossetti, Pharm.D., BCPS []  [place], Pharm.D., BCPS, AAHIVP []  Legrand Como, Pharm.D., BCPS, AAHIVP [x]  Salome Arnt, PharmD, BCPS []  Johnnette Gourd, PharmD, BCPS []  Hughes Better, PharmD, BCPS []  Leeroy Cha, PharmD []  Laqueta Linden, PharmD, BCPS []  Albertina Parr, PharmD  [place] Team []  Leodis Sias, PharmD []  Lindell Spar, PharmD []  Royetta Asal, PharmD []  Graylin Shiver, Rph []  Rema Fendt) Glennon Mac, PharmD []  Arlyn Dunning, PharmD []  Netta Cedars, PharmD []  Dia Sitter, PharmD []  Leone Haven, PharmD []  Gretta Arab, PharmD []  Theodis Shove, PharmD []  Peggyann Juba, PharmD []  Reuel Boom, PharmD   Positive urine culture Treated with Cephalexin, organism sensitive to the same and no further patient follow-up is required at this time.  Harlon Flor Talley 03/08/2019, 10:14 AM

## 2019-05-01 ENCOUNTER — Encounter (HOSPITAL_COMMUNITY): Payer: Self-pay | Admitting: *Deleted

## 2019-05-01 ENCOUNTER — Other Ambulatory Visit: Payer: Self-pay

## 2019-05-01 ENCOUNTER — Emergency Department (HOSPITAL_COMMUNITY)
Admission: EM | Admit: 2019-05-01 | Discharge: 2019-05-01 | Disposition: A | Discharge status: Home / Self Care | Payer: Medicaid Other | Attending: Emergency Medicine | Admitting: Emergency Medicine

## 2019-05-01 DIAGNOSIS — J45909 Unspecified asthma, uncomplicated: Secondary | ICD-10-CM | POA: Insufficient documentation

## 2019-05-01 DIAGNOSIS — R350 Frequency of micturition: Secondary | ICD-10-CM | POA: Insufficient documentation

## 2019-05-01 DIAGNOSIS — Z202 Contact with and (suspected) exposure to infections with a predominantly sexual mode of transmission: Secondary | ICD-10-CM | POA: Insufficient documentation

## 2019-05-01 DIAGNOSIS — Z79899 Other long term (current) drug therapy: Secondary | ICD-10-CM | POA: Insufficient documentation

## 2019-05-01 LAB — URINALYSIS, ROUTINE W REFLEX MICROSCOPIC
Bilirubin Urine: NEGATIVE
Glucose, UA: NEGATIVE mg/dL
Hgb urine dipstick: NEGATIVE
Ketones, ur: NEGATIVE mg/dL
Leukocytes,Ua: NEGATIVE
Nitrite: NEGATIVE
Protein, ur: NEGATIVE mg/dL
Specific Gravity, Urine: 1.026 (ref 1.005–1.030)
pH: 5 (ref 5.0–8.0)

## 2019-05-01 LAB — HIV ANTIBODY (ROUTINE TESTING W REFLEX): HIV Screen 4th Generation wRfx: NONREACTIVE

## 2019-05-01 MED ORDER — LIDOCAINE HCL (PF) 1 % IJ SOLN
1.0000 mL | Freq: Once | INTRAMUSCULAR | Status: AC
  Administered 2019-05-01: 1 mL
  Filled 2019-05-01: qty 5

## 2019-05-01 MED ORDER — CEFTRIAXONE SODIUM 500 MG IJ SOLR
500.0000 mg | Freq: Once | INTRAMUSCULAR | Status: AC
  Administered 2019-05-01: 500 mg via INTRAMUSCULAR
  Filled 2019-05-01: qty 500

## 2019-05-01 MED ORDER — AZITHROMYCIN 250 MG PO TABS
1000.0000 mg | ORAL_TABLET | Freq: Once | ORAL | Status: AC
  Administered 2019-05-01: 1000 mg via ORAL
  Filled 2019-05-01: qty 4

## 2019-05-01 NOTE — Discharge Instructions (Signed)
Return to the ED with any concerns including fever, vomiting, blood in urine, testicular pain, or any other alarming symptoms

## 2019-05-01 NOTE — ED Triage Notes (Addendum)
Pt was notified by his girlfriend that she just tested positive for clamydia.  Pt has  "constant urge to pee" and would like to be evaluated.

## 2019-05-01 NOTE — ED Provider Notes (Addendum)
[facility] EMERGENCY DEPARTMENT Provider Note   CSN: [id number] Arrival date & time: 05/01/19  1126     History Chief Complaint  Patient presents with  . Exposure to STD    Marsden Zaino is a 29 y.o. male.  HPI  Pt presenting due to girlfriend telling him that she has been diagnosed with chlamydia.  Pt states he feels urge to urinate frequently which started a few days ago as well.  Denies penile discharge, no pain with urination.  No testicular pain or swelling.  No fever/chills.  Per chart review patient was diagnosed with UTI in 11/20- he was treated with keflex, he states those symptoms resolved completely.  There are no other associated systemic symptoms, there are no other alleviating or modifying factors.      Past Medical History:  Diagnosis Date  . Asthma   . Obesity     There are no problems to display for this patient.   Past Surgical History:  Procedure Laterality Date  . DENTAL SURGERY         No family history on file.  Social History   Tobacco Use  . Smoking status: Never Smoker  . Smokeless tobacco: Never Used  Substance Use Topics  . Alcohol use: No  . Drug use: No    Home Medications Prior to Admission medications   Medication Sig Start Date End Date Taking? Authorizing Provider  cephALEXin (KEFLEX) 500 MG capsule Take 1 capsule (500 mg total) by mouth 4 (four) times daily. 03/04/19   Petrucelli, Samantha R, PA-C  fluticasone (FLONASE) 50 MCG/ACT nasal spray Place 1 spray into both nostrils daily. Patient not taking: Reported on 03/04/2019 07/28/18 03/04/19  Hedges, Dellis Filbert, PA-C  omeprazole (PRILOSEC) 20 MG capsule Take 1 capsule (20 mg total) by mouth daily. Patient not taking: Reported on 03/04/2019 10/06/18 03/04/19  Palumbo, April, MD    Allergies    Benadryl [diphenhydramine hcl (sleep)] and Kiwi extract  Review of Systems   Review of Systems  ROS reviewed and all otherwise negative except for mentioned in  HPI  Physical Exam Updated Vital Signs BP 111/89   Pulse 72   Temp 98.9 F (37.2 C) (Temporal)   Resp 19   Wt 136.1 kg   SpO2 99%   BMI 40.69 kg/m  Vitals reviewed Physical Exam  Physical Examination: General appearance - alert, well appearing, and in no distress Mental status - alert, oriented to person, place, and time Eyes - no conjunctival injection, no scleral icterus Chest - normal respiratory effort GU Male - no penile lesions or discharge, no testicular masses or tenderness Extremities - no swelling Skin - normal coloration and turgor, no rashes  ED Results / Procedures / Treatments   Labs (all labs ordered are listed, but only abnormal results are displayed) Labs Reviewed  URINE CULTURE  URINALYSIS, ROUTINE W REFLEX MICROSCOPIC  HIV ANTIBODY (ROUTINE TESTING W REFLEX)  RPR  GC/CHLAMYDIA PROBE AMP ([place]) NOT AT [facility]    EKG None  Radiology No results found.  Procedures Procedures (including critical care time)  Medications Ordered in ED Medications  azithromycin (ZITHROMAX) tablet 1,000 mg (1,000 mg Oral Given 05/01/19 1314)  cefTRIAXone (ROCEPHIN) injection 500 mg (500 mg Intramuscular Given 05/01/19 1315)  lidocaine (PF) (XYLOCAINE) 1 % injection 1 mL (1 mL Other Given 05/01/19 1315)    ED Course  I have reviewed the triage vital signs and the nursing notes.  Pertinent labs & imaging results that were available  during my care of the patient were reviewed by me and considered in my medical decision making (see chart for details).    MDM Rules/Calculators/A&P                      Pt presenting with c/o urinary frequency as well as exposure to chlamydia.  Urinalysis is reassuring, gen probe, RPR and HIV sent. Urine culture sent as well due to recent culture positive UTI.  Pt was treated prophylactically with zithromax and rocephin.  Pt discharged with strict return precautions.  Mom agreeable with plan Final Clinical Impression(s) / ED  Diagnoses Final diagnoses:  STD exposure    Rx / DC Orders ED Discharge Orders    None       Nehemiah Montee, Latanya Maudlin, MD 05/01/19 1432    Phillis Haggis, MD 05/01/19 1433

## 2019-05-02 LAB — RPR: RPR Ser Ql: NONREACTIVE

## 2019-05-02 LAB — URINE CULTURE

## 2019-05-04 LAB — GC/CHLAMYDIA PROBE AMP (~~LOC~~) NOT AT ARMC
Chlamydia: NEGATIVE
Neisseria Gonorrhea: NEGATIVE

## 2019-06-09 ENCOUNTER — Encounter (HOSPITAL_COMMUNITY): Payer: Self-pay | Admitting: Emergency Medicine

## 2019-06-09 ENCOUNTER — Emergency Department (HOSPITAL_COMMUNITY)
Admission: EM | Admit: 2019-06-09 | Discharge: 2019-06-09 | Disposition: A | Discharge status: Home / Self Care | Payer: PRIVATE HEALTH INSURANCE | Attending: Emergency Medicine | Admitting: Emergency Medicine

## 2019-06-09 ENCOUNTER — Other Ambulatory Visit: Payer: Self-pay

## 2019-06-09 DIAGNOSIS — R300 Dysuria: Secondary | ICD-10-CM | POA: Diagnosis not present

## 2019-06-09 DIAGNOSIS — J45909 Unspecified asthma, uncomplicated: Secondary | ICD-10-CM | POA: Insufficient documentation

## 2019-06-09 DIAGNOSIS — L731 Pseudofolliculitis barbae: Secondary | ICD-10-CM | POA: Diagnosis not present

## 2019-06-09 LAB — URINALYSIS, ROUTINE W REFLEX MICROSCOPIC
Bilirubin Urine: NEGATIVE
Glucose, UA: NEGATIVE mg/dL
Hgb urine dipstick: NEGATIVE
Ketones, ur: NEGATIVE mg/dL
Leukocytes,Ua: NEGATIVE
Nitrite: NEGATIVE
Protein, ur: NEGATIVE mg/dL
Specific Gravity, Urine: 1.018 (ref 1.005–1.030)
pH: 6 (ref 5.0–8.0)

## 2019-06-09 LAB — HIV ANTIBODY (ROUTINE TESTING W REFLEX): HIV Screen 4th Generation wRfx: NONREACTIVE

## 2019-06-09 MED ORDER — CEFTRIAXONE SODIUM 500 MG IJ SOLR
500.0000 mg | Freq: Once | INTRAMUSCULAR | Status: AC
  Administered 2019-06-09: 500 mg via INTRAMUSCULAR
  Filled 2019-06-09: qty 500

## 2019-06-09 MED ORDER — METRONIDAZOLE 500 MG PO TABS
2000.0000 mg | ORAL_TABLET | Freq: Once | ORAL | Status: AC
  Administered 2019-06-09: 2000 mg via ORAL
  Filled 2019-06-09: qty 4

## 2019-06-09 MED ORDER — DOXYCYCLINE HYCLATE 100 MG PO CAPS: 100.0000 mg | ORAL_CAPSULE | Freq: Two times a day (BID) | ORAL | 0 refills | Status: AC

## 2019-06-09 MED ORDER — LIDOCAINE HCL (PF) 1 % IJ SOLN
INTRAMUSCULAR | Status: AC
  Administered 2019-06-09: 0.9 mL
  Filled 2019-06-09: qty 5

## 2019-06-09 NOTE — ED Provider Notes (Signed)
[facility] EMERGENCY DEPARTMENT Provider Note   CSN: [id number] Arrival date & time: 06/09/19  1514     History Chief Complaint  Patient presents with  . Urinary Urgency    Dylan Carpenter is a 29 y.o. male.  HPI   29 year old male with a history of asthma, obesity, who presents to the emergency department today for evaluation of dysuria.  He has had symptoms for the last day.  He also reports urinary frequency.  He denies any hematuria, abdominal pain, nausea, vomiting.  He had similar symptoms last month and was told by his girlfriend that she was diagnosed with chlamydia.  He was treated at that time and symptoms had resolved.  He had not had intercourse with her since then up until this week.  He also notes that he had a small bump to the lower abdomen that drained a small amount of purulent drainage and blood. His sxs resolved but he has developed another similar bump to the lower abdomen.   Past Medical History:  Diagnosis Date  . Asthma   . Obesity     There are no problems to display for this patient.   Past Surgical History:  Procedure Laterality Date  . DENTAL SURGERY         No family history on file.  Social History   Tobacco Use  . Smoking status: Never Smoker  . Smokeless tobacco: Never Used  Substance Use Topics  . Alcohol use: No  . Drug use: No    Home Medications Prior to Admission medications   Medication Sig Start Date End Date Taking? Authorizing Provider  cephALEXin (KEFLEX) 500 MG capsule Take 1 capsule (500 mg total) by mouth 4 (four) times daily. 03/04/19   Petrucelli, Samantha R, PA-C  doxycycline (VIBRAMYCIN) 100 MG capsule Take 1 capsule (100 mg total) by mouth 2 (two) times daily for 7 days. 06/09/19 06/16/19  Aiven Kampe S, PA-C  fluticasone (FLONASE) 50 MCG/ACT nasal spray Place 1 spray into both nostrils daily. Patient not taking: Reported on 03/04/2019 07/28/18 03/04/19  Hedges, Tinnie Gens, PA-C  omeprazole  (PRILOSEC) 20 MG capsule Take 1 capsule (20 mg total) by mouth daily. Patient not taking: Reported on 03/04/2019 10/06/18 03/04/19  Palumbo, April, MD    Allergies    Benadryl [diphenhydramine hcl (sleep)] and Kiwi extract  Review of Systems   Review of Systems  Gastrointestinal: Negative for nausea and vomiting.  Genitourinary: Positive for dysuria and frequency. Negative for decreased urine volume, genital sores, hematuria, penile pain, penile swelling, scrotal swelling and urgency.  Skin:       Bump on skin  All other systems reviewed and are negative.   Physical Exam Updated Vital Signs BP 137/64   Pulse 69   Temp 98.9 F (37.2 C) (Oral)   Resp 16   SpO2 100%   Physical Exam Constitutional:      General: He is not in acute distress.    Appearance: He is well-developed.  Eyes:     Conjunctiva/sclera: Conjunctivae normal.  Cardiovascular:     Rate and Rhythm: Normal rate and regular rhythm.  Pulmonary:     Effort: Pulmonary effort is normal.     Breath sounds: Normal breath sounds.  Skin:    General: Skin is warm and dry.     Comments: Small raised papule to the mons pubis consistent with ingrown hair  Neurological:     Mental Status: He is alert and oriented to person, place, and time.  ED Results / Procedures / Treatments   Labs (all labs ordered are listed, but only abnormal results are displayed) Labs Reviewed  URINE CULTURE  URINALYSIS, ROUTINE W REFLEX MICROSCOPIC  HIV ANTIBODY (ROUTINE TESTING W REFLEX)  RPR  GC/CHLAMYDIA PROBE AMP ([place]) NOT AT [facility]    EKG None  Radiology No results found.  Procedures Procedures (including critical care time)  Medications Ordered in ED Medications  cefTRIAXone (ROCEPHIN) injection 500 mg (500 mg Intramuscular Given 06/09/19 1620)  metroNIDAZOLE (FLAGYL) tablet 2,000 mg (2,000 mg Oral Given 06/09/19 1622)  lidocaine (PF) (XYLOCAINE) 1 % injection (0.9 mLs  Given 06/09/19 1621)    ED Course  I have  reviewed the triage vital signs and the nursing notes.  Pertinent labs & imaging results that were available during my care of the patient were reviewed by me and considered in my medical decision making (see chart for details).    MDM Rules/Calculators/A&P                      29 year old male presenting for dysuria. Had recent exposure to chlamydia last  Month and is having similar sxs.   He also notes that he had a small bump to the lower abdomen that drained a small amount of purulent drainage and blood. His sxs resolved but he has developed another similar bump to the lower abdomen.  On exam it appears that he has a small ingrown hair to this area that is not infected or showing signs of cellulitis.  Advised warm compresses.  Reviewed urinalysis, there are no leukocytes, no nitrites.  I have increased suspicion for STI.  Will repeat GC/chlamydia testing.  We will also add HIV/RPR and urine culture.  Reviewed records.  His the urine culture during his last visit grew out multiple species.  His STI testing was also notably negative.  I will treat him with Rocephin and Flagyl.  I will also discharge him on doxycycline.  Given his symptoms have recurred even though his testing was negative, will refer to urology for further evaluation if his testing continues to be negative and he still has symptoms.  Have advised him to return to ED for new or worsening symptoms.  He voiced understanding plan reasons to return.  All Questions answered.  Patient stable for discharge.  Final Clinical Impression(s) / ED Diagnoses Final diagnoses:  Dysuria    Rx / DC Orders ED Discharge Orders         Ordered    doxycycline (VIBRAMYCIN) 100 MG capsule  2 times daily     06/09/19 1619           Jennife Zaucha S, PA-C 06/09/19 1627    Noemi Chapel, MD 06/12/19 1314

## 2019-06-09 NOTE — ED Triage Notes (Signed)
Patient c/o constant urge to void and a "sting" with urination and a bump above his penis x 1 day. Patient states he was treated about a month and a half ago after his girlfriend was treated for chlamydia. Patient states they were not sexually active since then til a few days ago.

## 2019-06-09 NOTE — Discharge Instructions (Addendum)
You have been tested for HIV, syphilis, chlamydia and gonorrhea.  These results will be available in approximately 3 days and you will be contacted by the hospital if the results are positive. Avoid sexual contact until you are aware of the results, and please inform all sexual partners if you test positive for any of these diseases.  A culture was sent of your urine today to determine if there is any bacterial growth. If the results of the culture are positive and you require an antibiotic or a change of your prescribed antibiotic you will be contacted by the hospital. If the results are negative you will not be contacted.  You were given a prescription for antibiotics. Please take the antibiotic prescription fully. Do not have intercourse until you have finished the antibiotics fully. If your results of your STD screening are positive, then your partner will also need to be treated and you should not have intercourse with them again until they are fully treated as well.   If the results of your tests are negative, I have given you a referral to a urologist to follow up with in regards to your symptoms. Please call the office to schedule and appointment for follow up.   Please follow up with your primary care provider within 5-7 days for re-evaluation of your symptoms. If you do not have a primary care provider, information for a healthcare clinic has been provided for you to make arrangements for follow up care. Please return to the emergency department for any new or worsening symptoms.

## 2019-06-10 LAB — GC/CHLAMYDIA PROBE AMP (~~LOC~~) NOT AT ARMC
Chlamydia: NEGATIVE
Neisseria Gonorrhea: NEGATIVE

## 2019-06-10 LAB — RPR: RPR Ser Ql: NONREACTIVE

## 2019-06-11 LAB — URINE CULTURE: Culture: NO GROWTH

## 2020-12-05 IMAGING — US US EXTREM LOW VENOUS*L*
1 series · 13 of 24 positions shown · non-contrast
Comparison: None.

CLINICAL DATA: Left leg pain, swelling, and induration since injury
04/14/2018



[Series 1: us extrem low venous*left* · 0.08mm/px · 13 of 40 slices shown]
[im 1/40]
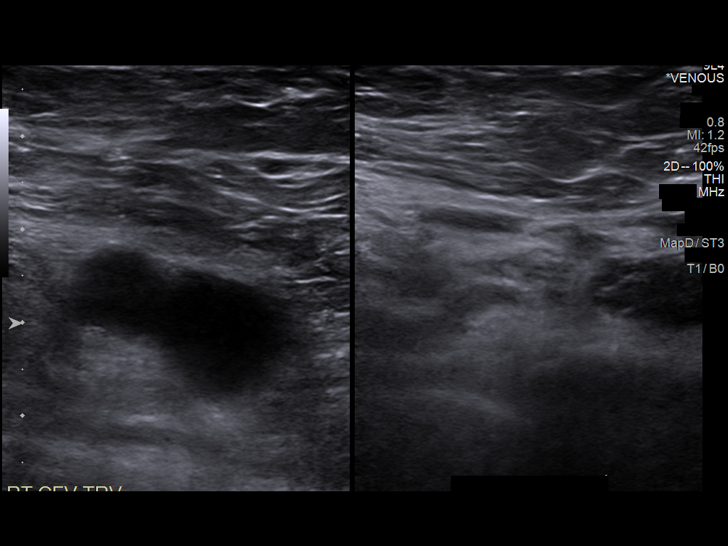
[im 4/40]
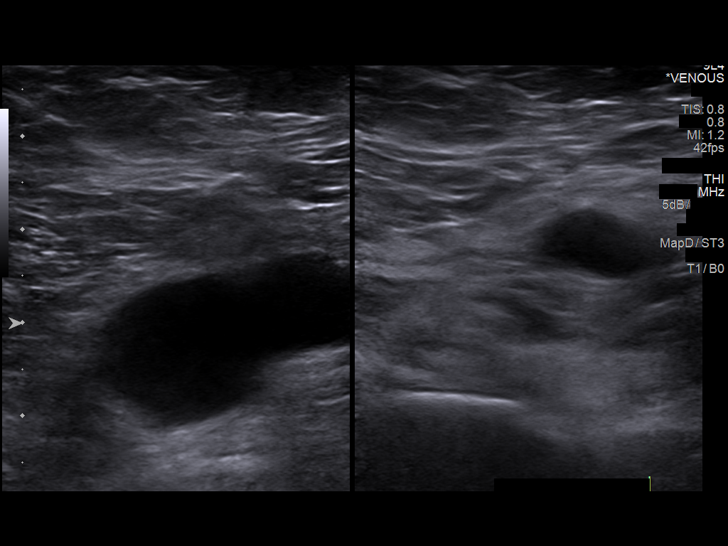
[im 7/40]
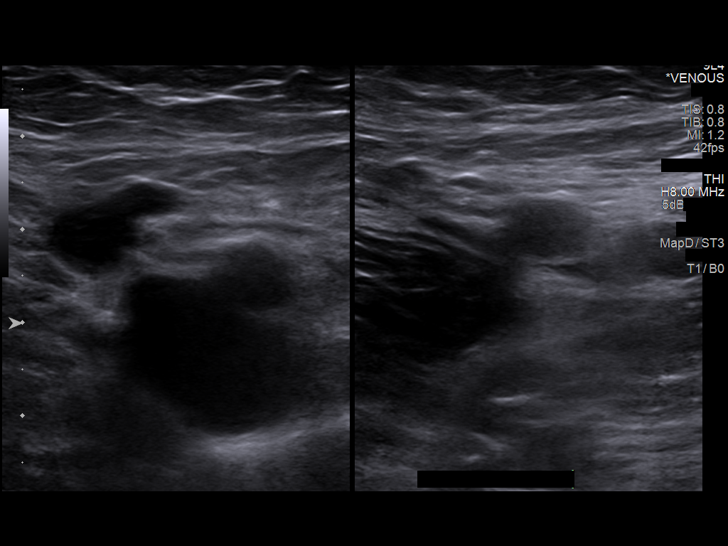
[im 11/40]
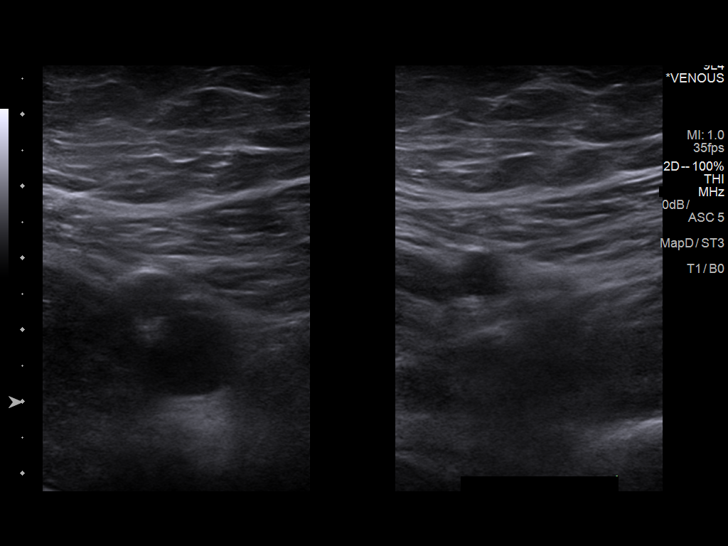
[im 14/40]
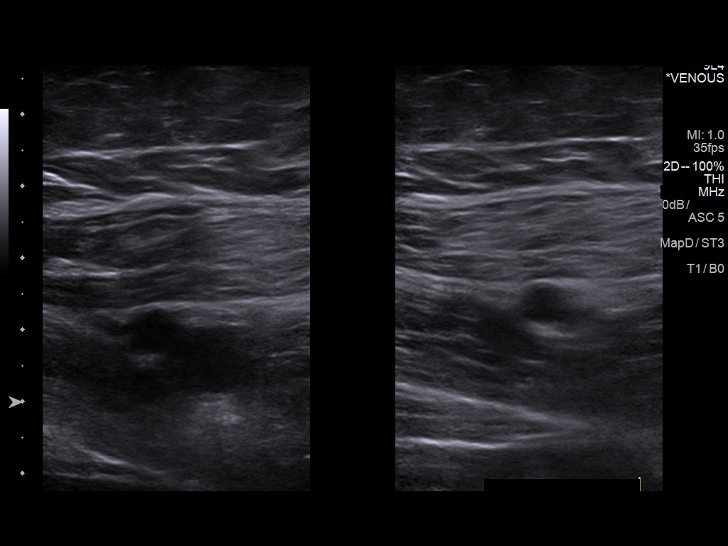
[im 17/40]
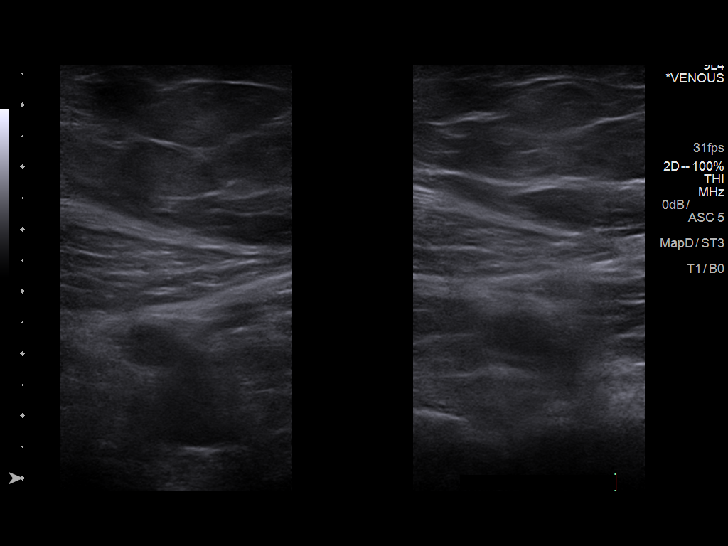
[im 21/40]
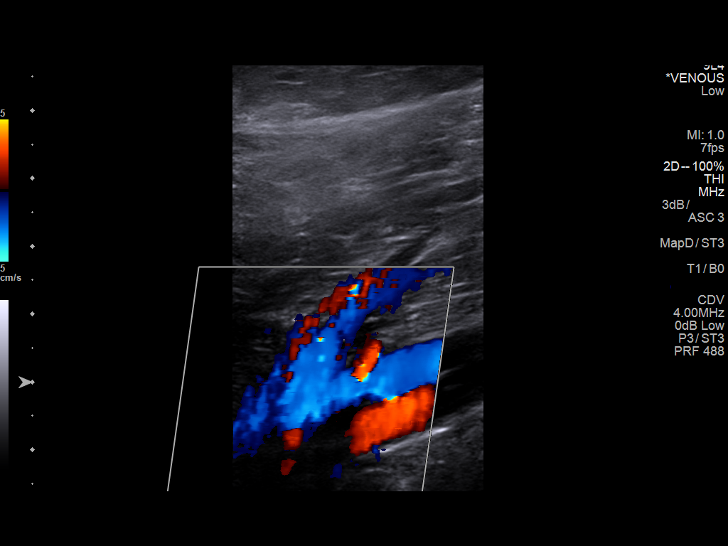
[im 23/40]
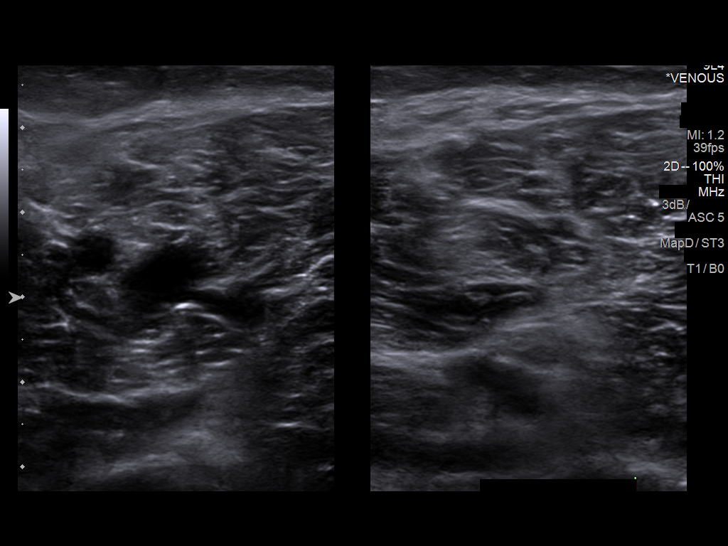
[im 26/40]
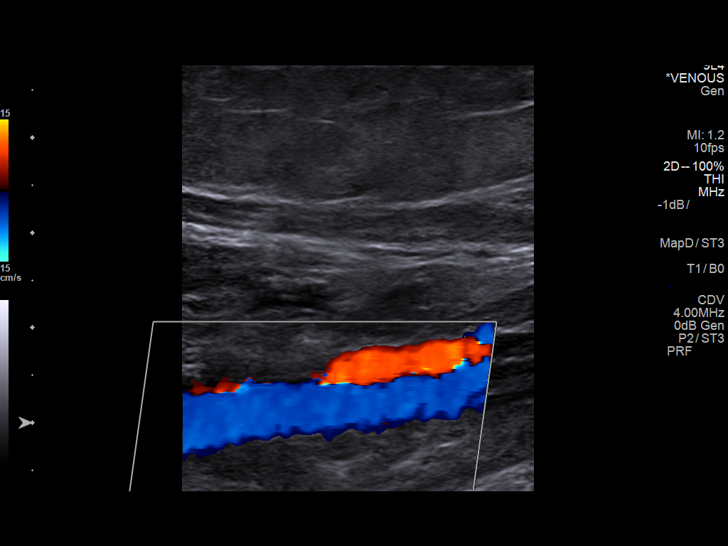
[im 29/40]
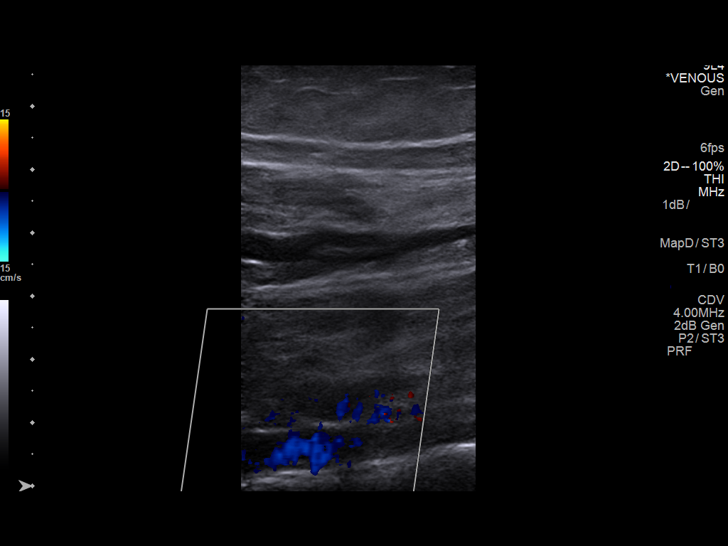
[im 33/40]
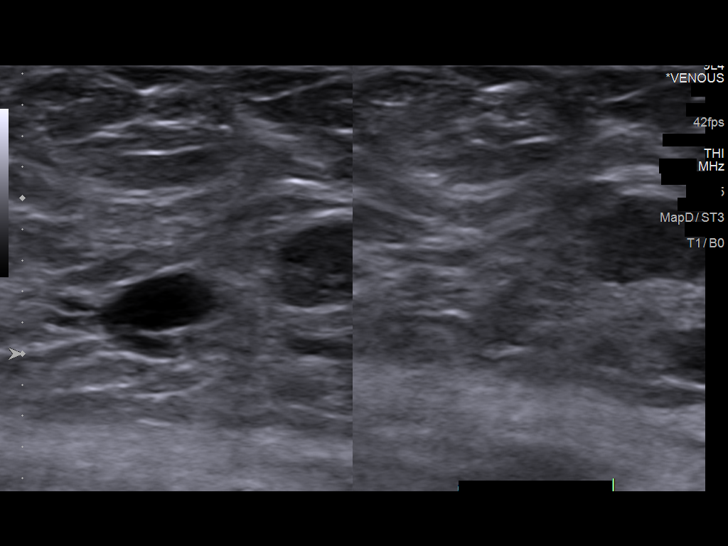
[im 36/40]
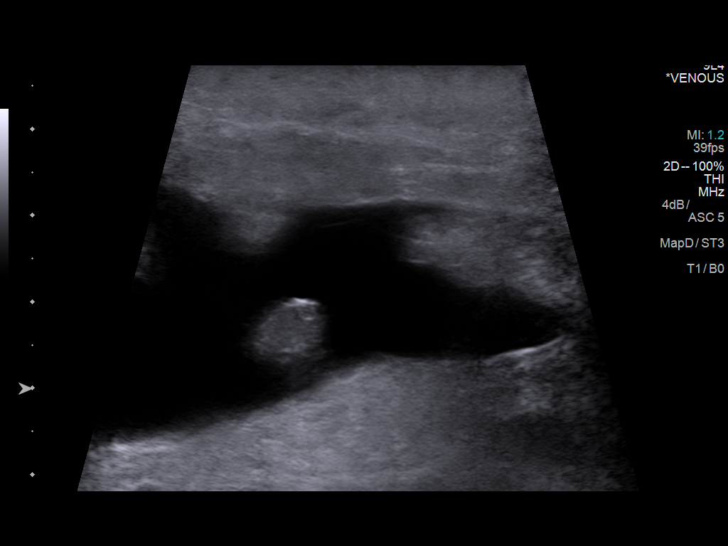
[im 40/40]
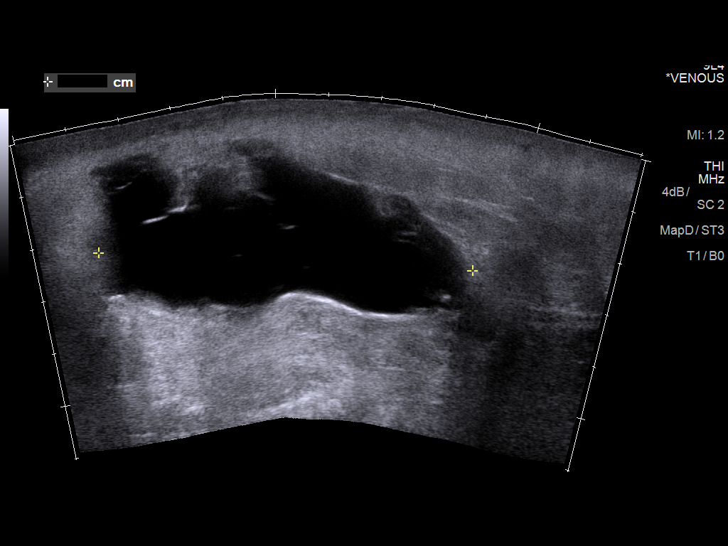

[13 of 24 positions shown; findings below may reference images not displayed]

FINDINGS: Contralateral Common Femoral Vein: Respiratory phasicity is normal
and symmetric with the symptomatic side. No evidence of thrombus.
Normal compressibility.

Common Femoral Vein: No evidence of thrombus. Normal
compressibility, respiratory phasicity and response to augmentation.

Saphenofemoral Junction: No evidence of thrombus. Normal
compressibility and flow on color Doppler imaging.

Profunda Femoral Vein: No evidence of thrombus. Normal
compressibility and flow on color Doppler imaging.

Femoral Vein: No evidence of thrombus. Normal compressibility,
respiratory phasicity and response to augmentation.

Popliteal Vein: No evidence of thrombus. Normal compressibility,
respiratory phasicity and response to augmentation.

Calf Veins: No evidence of thrombus. Normal compressibility and flow
on color Doppler imaging.

Superficial Great Saphenous Vein: No evidence of thrombus. Normal
compressibility.

Venous Reflux:  None.

Other Findings: Within the subcutaneous tissues of the medial distal
thigh is an elongated 16.9 x 2.8 x 7.0 cm avascular fluid
collection.
IMPRESSION: 1. No evidence of left lower extremity deep venous thrombosis.
2. Large avascular fluid collection in the medial distal thigh
measuring 17 cm in length likely liquefying hematoma given history
of recent trauma.

## 2020-12-18 ENCOUNTER — Other Ambulatory Visit: Payer: Self-pay

## 2020-12-18 ENCOUNTER — Encounter (HOSPITAL_COMMUNITY): Payer: Self-pay

## 2020-12-18 ENCOUNTER — Emergency Department (HOSPITAL_COMMUNITY)
Admission: EM | Admit: 2020-12-18 | Discharge: 2020-12-18 | Disposition: A | Discharge status: Home / Self Care | Payer: Medicaid Other | Attending: Emergency Medicine | Admitting: Emergency Medicine

## 2020-12-18 DIAGNOSIS — R509 Fever, unspecified: Secondary | ICD-10-CM | POA: Insufficient documentation

## 2020-12-18 DIAGNOSIS — R0981 Nasal congestion: Secondary | ICD-10-CM | POA: Insufficient documentation

## 2020-12-18 DIAGNOSIS — E871 Hypo-osmolality and hyponatremia: Secondary | ICD-10-CM | POA: Insufficient documentation

## 2020-12-18 DIAGNOSIS — M791 Myalgia, unspecified site: Secondary | ICD-10-CM | POA: Insufficient documentation

## 2020-12-18 DIAGNOSIS — R112 Nausea with vomiting, unspecified: Secondary | ICD-10-CM | POA: Insufficient documentation

## 2020-12-18 DIAGNOSIS — K529 Noninfective gastroenteritis and colitis, unspecified: Secondary | ICD-10-CM

## 2020-12-18 DIAGNOSIS — J45909 Unspecified asthma, uncomplicated: Secondary | ICD-10-CM | POA: Insufficient documentation

## 2020-12-18 DIAGNOSIS — Z20822 Contact with and (suspected) exposure to covid-19: Secondary | ICD-10-CM | POA: Insufficient documentation

## 2020-12-18 LAB — COMPREHENSIVE METABOLIC PANEL
ALT: 26 U/L (ref 0–44)
AST: 28 U/L (ref 15–41)
Albumin: 4 g/dL (ref 3.5–5.0)
Alkaline Phosphatase: 51 U/L (ref 38–126)
Anion gap: 11 (ref 5–15)
BUN: 12 mg/dL (ref 6–20)
CO2: 21 mmol/L — ABNORMAL LOW (ref 22–32)
Calcium: 9.1 mg/dL (ref 8.9–10.3)
Chloride: 100 mmol/L (ref 98–111)
Creatinine, Ser: 0.87 mg/dL (ref 0.61–1.24)
GFR, Estimated: >60 mL/min (ref >60)
Glucose, Bld: 134 mg/dL — ABNORMAL HIGH (ref 70–99)
Potassium: 3.6 mmol/L (ref 3.5–5.1)
Sodium: 132 mmol/L — ABNORMAL LOW (ref 135–145)
Total Bilirubin: 1.4 mg/dL — ABNORMAL HIGH (ref 0.3–1.2)
Total Protein: 7.9 g/dL (ref 6.5–8.1)

## 2020-12-18 LAB — URINALYSIS, ROUTINE W REFLEX MICROSCOPIC
Bilirubin Urine: NEGATIVE
Glucose, UA: NEGATIVE mg/dL
Hgb urine dipstick: NEGATIVE
Ketones, ur: NEGATIVE mg/dL
Leukocytes,Ua: NEGATIVE
Nitrite: NEGATIVE
Protein, ur: NEGATIVE mg/dL
Specific Gravity, Urine: 1.026 (ref 1.005–1.030)
pH: 5 (ref 5.0–8.0)

## 2020-12-18 LAB — CBC
HCT: 43.9 % (ref 39.0–52.0)
Hemoglobin: 14.8 g/dL (ref 13.0–17.0)
MCH: 30 pg (ref 26.0–34.0)
MCHC: 33.7 g/dL (ref 30.0–36.0)
MCV: 88.9 fL (ref 80.0–100.0)
Platelets: 317 10*3/uL (ref 150–400)
RBC: 4.94 MIL/uL (ref 4.22–5.81)
RDW: 13.3 % (ref 11.5–15.5)
WBC: 9.9 10*3/uL (ref 4.0–10.5)
nRBC: 0 % (ref 0.0–0.2)

## 2020-12-18 LAB — LIPASE, BLOOD: Lipase: 23 U/L (ref 11–51)

## 2020-12-18 MED ORDER — ONDANSETRON 4 MG PO TBDP
4.0000 mg | ORAL_TABLET | Freq: Once | ORAL | Status: AC | PRN
  Administered 2020-12-18: 4 mg via ORAL
  Filled 2020-12-18: qty 1

## 2020-12-18 MED ORDER — ACETAMINOPHEN 325 MG PO TABS: 650.0000 mg | ORAL_TABLET | Freq: Four times a day (QID) | ORAL | Status: DC | PRN

## 2020-12-18 MED ORDER — SODIUM CHLORIDE 0.9 % IV BOLUS
1000.0000 mL | Freq: Once | INTRAVENOUS | Status: AC
  Administered 2020-12-18: 1000 mL via INTRAVENOUS

## 2020-12-18 MED ORDER — ONDANSETRON 4 MG PO TBDP: 4.0000 mg | ORAL_TABLET | Freq: Three times a day (TID) | ORAL | 0 refills | Status: DC | PRN

## 2020-12-18 NOTE — Discharge Instructions (Addendum)
As we discussed believe that you have viral gastroenteritis either from COVID or another source.  Please return if your abdominal pain worsens or fails to improve, I have sent some antinausea medication to your pharmacy as we discussed.  Please eat and drink as much as you are able to with the nausea medication to help restore your strength.

## 2020-12-18 NOTE — ED Triage Notes (Signed)
Patient arrives with complaints of nausea, diarrhea, fatigue. X3 days. Patient arrives visibly diaphoretic.

## 2020-12-18 NOTE — ED Notes (Signed)
Patient discharge instructions reviewed with the patient. The patient verbalized understanding of instructions. Patient discharged. 

## 2020-12-18 NOTE — ED Provider Notes (Signed)
[facility] EMERGENCY DEPARTMENT Provider Note   CSN: [id number] Arrival date & time: 12/18/20  9470     History Chief Complaint  Patient presents with   Emesis   Diarrhea   Fatigue    Dylan Carpenter is a 30 y.o. male past medical history reports 3 days of watery diarrhea, nausea, vomiting and fever.  Patient denies any ingestion of questionable food sources.  Denies any sick contacts.  Patient denies hematemesis, melena, hematochezia.  Patient has not taken anything for his fever, nausea.  Patient also endorses myalgia, taste changes.  He denies chest pain, shortness of breath, cough, sore throat.  Patient is vaccinated X1 [employer] with no booster.   Emesis Associated symptoms: diarrhea, fever and myalgias   Associated symptoms: no cough and no sore throat   Diarrhea Associated symptoms: diaphoresis, fever, myalgias and vomiting       Past Medical History:  Diagnosis Date   Asthma    Obesity     There are no problems to display for this patient.   Past Surgical History:  Procedure Laterality Date   DENTAL SURGERY         History reviewed. No pertinent family history.  Social History   Tobacco Use   Smoking status: Never   Smokeless tobacco: Never  Vaping Use   Vaping Use: Never used  Substance Use Topics   Alcohol use: No   Drug use: No    Home Medications Prior to Admission medications   Medication Sig Start Date End Date Taking? Authorizing Provider  cephALEXin (KEFLEX) 500 MG capsule Take 1 capsule (500 mg total) by mouth 4 (four) times daily. 03/04/19   Petrucelli, Samantha R, PA-C  fluticasone (FLONASE) 50 MCG/ACT nasal spray Place 1 spray into both nostrils daily. Patient not taking: Reported on 03/04/2019 07/28/18 03/04/19  Hedges, Tinnie Gens, PA-C  omeprazole (PRILOSEC) 20 MG capsule Take 1 capsule (20 mg total) by mouth daily. Patient not taking: Reported on 03/04/2019 10/06/18 03/04/19  Palumbo, April, MD    Allergies     Benadryl [diphenhydramine hcl (sleep)] and Kiwi extract  Review of Systems   Review of Systems  Constitutional:  Positive for diaphoresis and fever.  HENT:  Negative for sore throat.   Respiratory:  Negative for cough, chest tightness and shortness of breath.   Cardiovascular:  Negative for chest pain and palpitations.  Gastrointestinal:  Positive for diarrhea, nausea and vomiting. Negative for anal bleeding and blood in stool.  Musculoskeletal:  Positive for myalgias.   Physical Exam Updated Vital Signs BP (!) 92/53 (BP Location: Left Arm)   Pulse 85   Temp 99.3 F (37.4 C)   Resp 16   Ht 5\' 11"  (1.803 m)   SpO2 95%   BMI 41.84 kg/m   Physical Exam Vitals and nursing note reviewed.  Constitutional:      Appearance: He is ill-appearing and diaphoretic.  HENT:     Head: Normocephalic and atraumatic.     Nose: Congestion present.     Mouth/Throat:     Pharynx: No oropharyngeal exudate or posterior oropharyngeal erythema.  Eyes:     General:        Right eye: No discharge.        Left eye: No discharge.  Cardiovascular:     Rate and Rhythm: Normal rate and regular rhythm.     Heart sounds: No murmur heard.   No friction rub. No gallop.  Pulmonary:     Effort: Pulmonary  effort is normal.     Breath sounds: Normal breath sounds.  Abdominal:     General: Bowel sounds are normal.     Palpations: Abdomen is soft.     Comments: Patient endorses some mild generalized tenderness to palpation, no rigidity, no guarding, no rebound.  No focal tenderness in any region.  Bowel sounds heard in all 4 quadrants.  Murphy's, McBurney's, Rovsing sign.  Musculoskeletal:     Cervical back: Normal range of motion and neck supple.  Skin:    General: Skin is warm.     Capillary Refill: Capillary refill takes less than 2 seconds.  Neurological:     Mental Status: He is alert and oriented to person, place, and time.  Psychiatric:        Mood and Affect: Mood normal.        Behavior:  Behavior normal.    ED Results / Procedures / Treatments   Labs (all labs ordered are listed, but only abnormal results are displayed) Labs Reviewed  COMPREHENSIVE METABOLIC PANEL - Abnormal; Notable for the following components:      Result Value   Sodium 132 (*)    CO2 21 (*)    Glucose, Bld 134 (*)    Total Bilirubin 1.4 (*)    All other components within normal limits  SARS CORONAVIRUS 2 (TAT 6-24 HRS)  LIPASE, BLOOD  CBC  URINALYSIS, ROUTINE W REFLEX MICROSCOPIC    EKG None  Radiology No results found.  Procedures Procedures   Medications Ordered in ED Medications  sodium chloride 0.9 % bolus 1,000 mL (has no administration in time range)  acetaminophen (TYLENOL) tablet 650 mg (has no administration in time range)  ondansetron (ZOFRAN-ODT) disintegrating tablet 4 mg (4 mg Oral Given 12/18/20 5366)    ED Course  I have reviewed the triage vital signs and the nursing notes.  Pertinent labs & imaging results that were available during my care of the patient were reviewed by me and considered in my medical decision making (see chart for details).    MDM Rules/Calculators/A&P                         Patient appears clinically dehydrated and ill.  Lab work is largely benign, with normal CBC, mild hyponatremia, mild none anion gap acidosis, minor elevation in T bili.  Patient has no chest pain, no shortness of breath, and no focal tenderness to palpation on his abdomen, specifically with no Murphy sign, hepatosplenomegaly, McBurney's point, Rovsing point tenderness.  Patient clinical condition appears improved with Tylenol, Zofran, and fluids.  Pending COVID swab, as high clinical suspicion for this viral syndrome given patient's presentation along with myalgias.  Discussed however could be a different type of viral gastroenteritis versus gastritis.  Do not favor acute abdomen at this time.  Encourage patient to rest, rehydrate, increase food intake, and prescription for  Zofran given for nausea.   Final Clinical Impression(s) / ED Diagnoses Final diagnoses:  None    Rx / DC Orders ED Discharge Orders     None        Olene Floss, PA-C 12/18/20 1253    Rozelle Logan, DO 12/19/20 1550

## 2020-12-19 LAB — SARS CORONAVIRUS 2 (TAT 6-24 HRS): SARS Coronavirus 2: NEGATIVE

## 2021-01-04 IMAGING — US US SCROTUM
1 series · 14 of 25 positions shown · non-contrast
Comparison: None.

CLINICAL DATA: Concern for scrotal abscess

EXAM:
SCROTAL ULTRASOUND
DOPPLER ULTRASOUND OF THE TESTICLES
TECHNIQUE: Complete ultrasound examination of the testicles, epididymis, and
other scrotal structures was performed. Color and spectral Doppler
ultrasound were also utilized to evaluate blood flow to the
testicles.

[Series 1: us scrotum · 40 acquisitions, 14 frames shown]
[im 1/40]
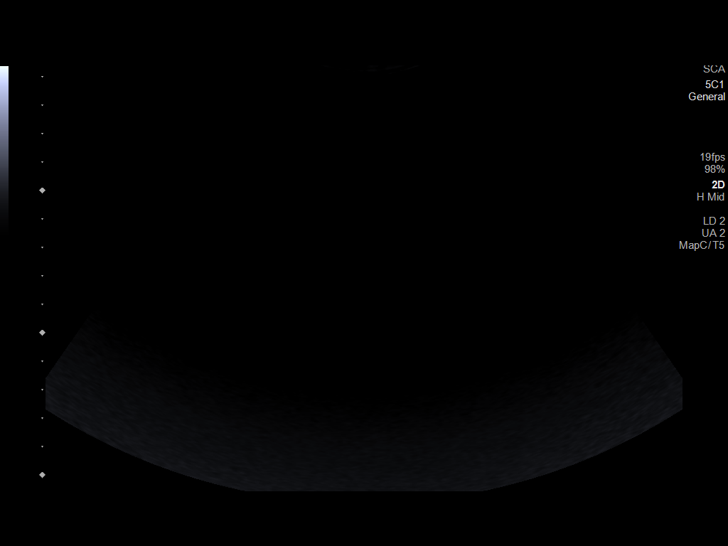
[im 4/40]
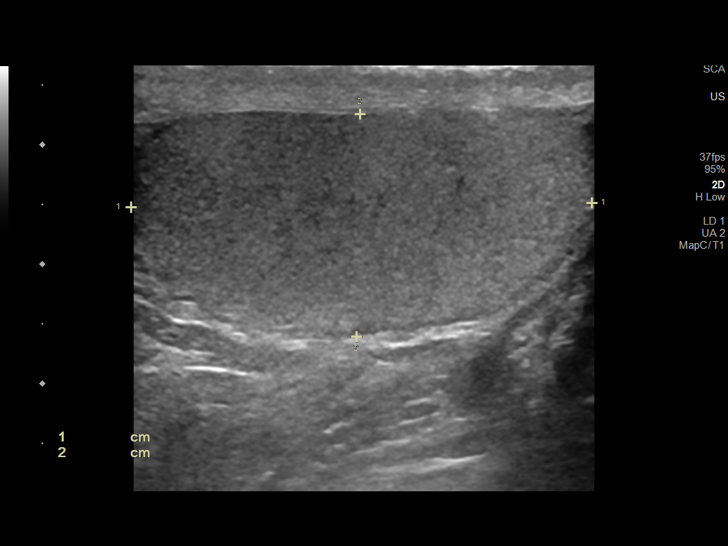
[im 7/40]
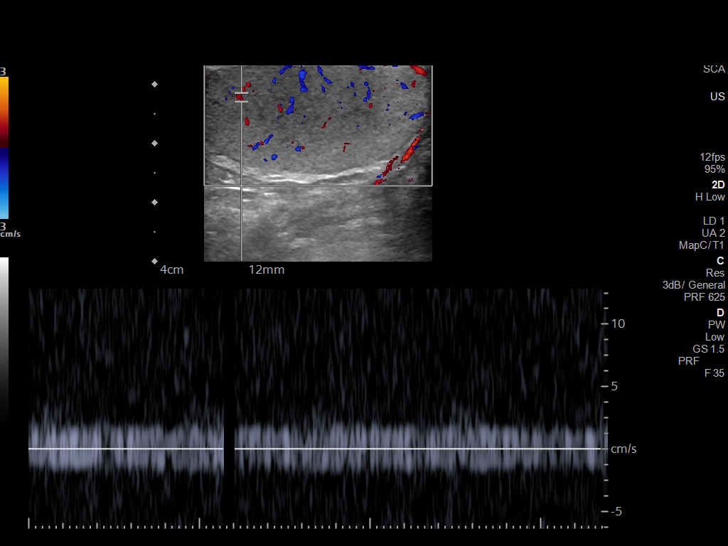
[im 10/40]
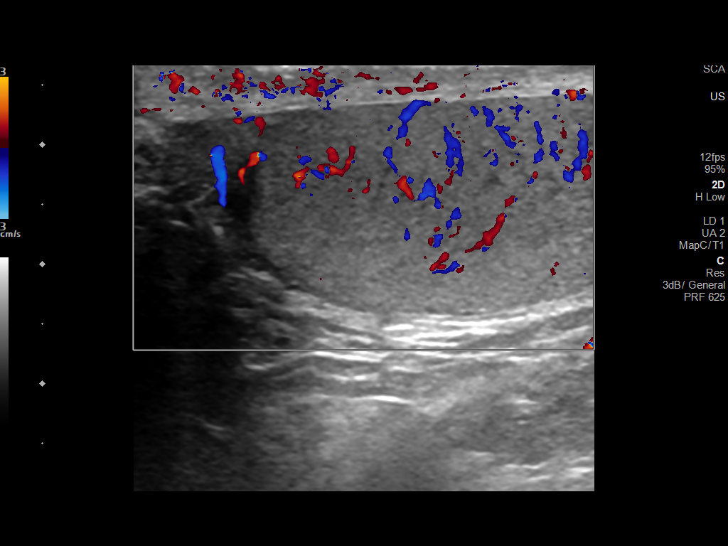
[im 14/40]
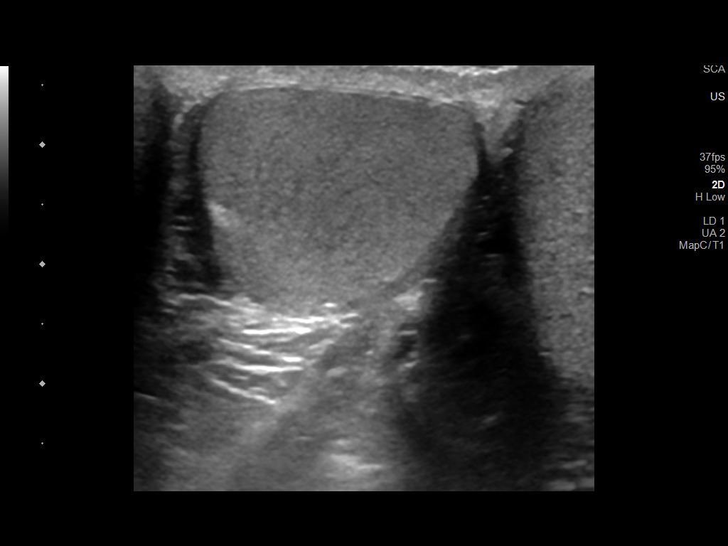
[im 15/40]
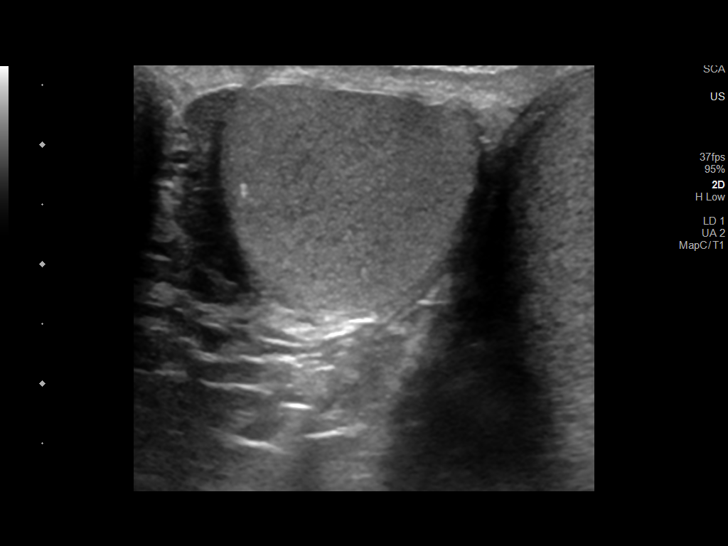
[im 18/40]
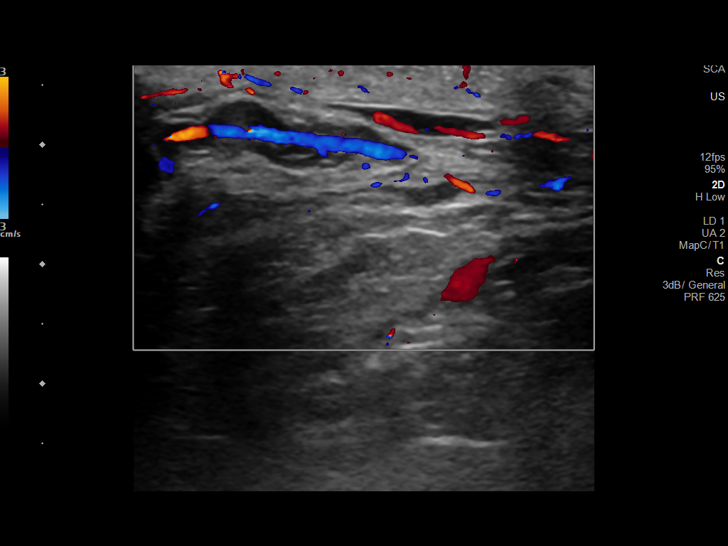
[im 22/40]
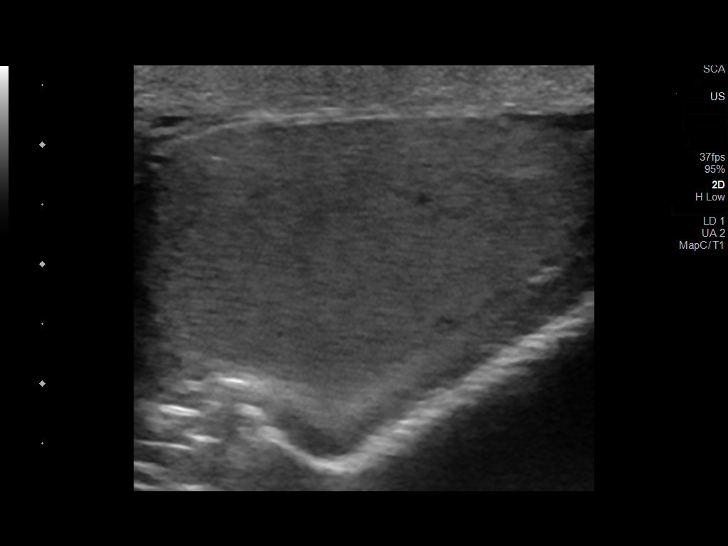
[im 25/40]
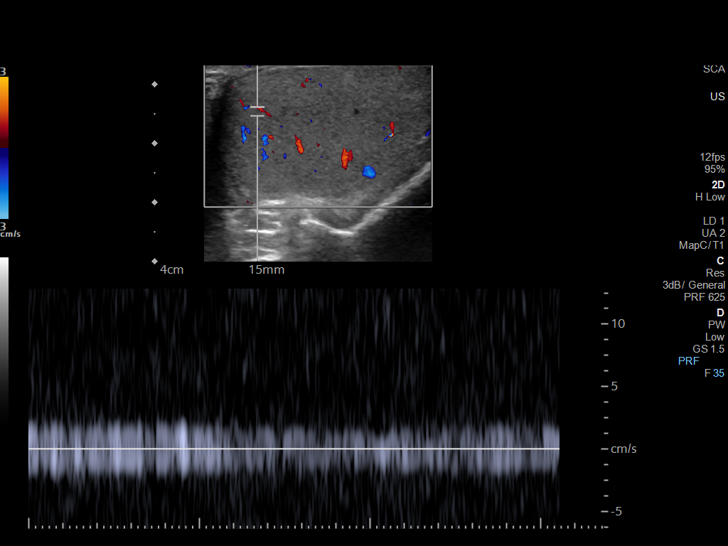
[im 27/40]
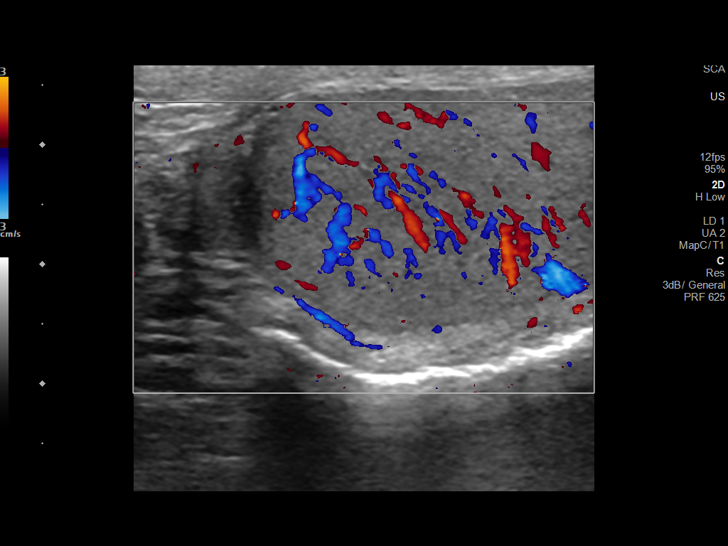
[im 30/40]
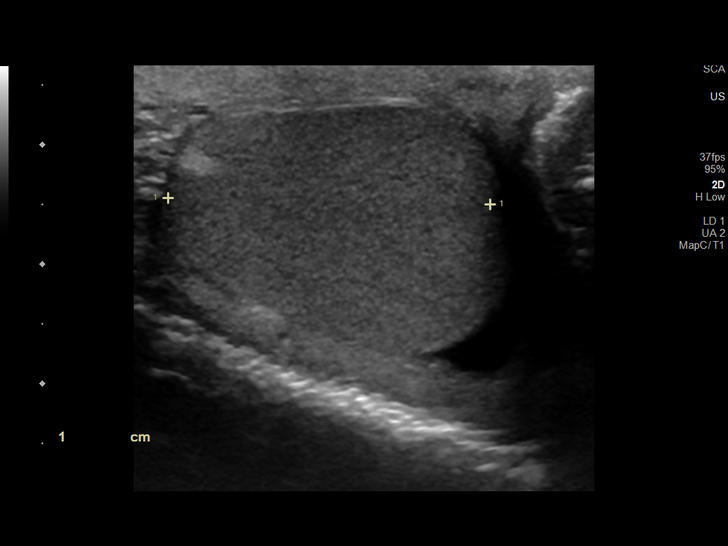
[im 33/40]
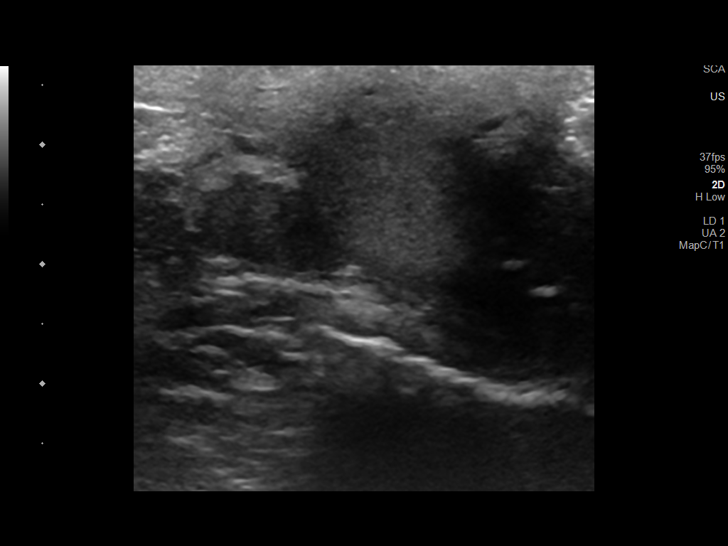
[im 36/40]
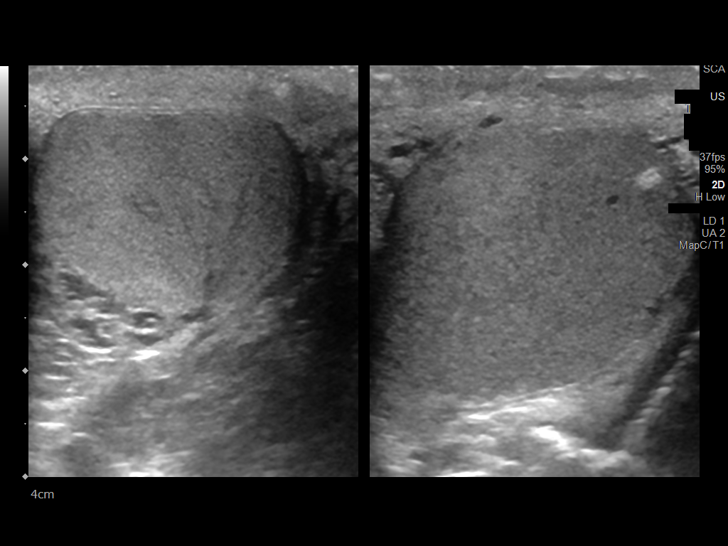
[im 40/40]
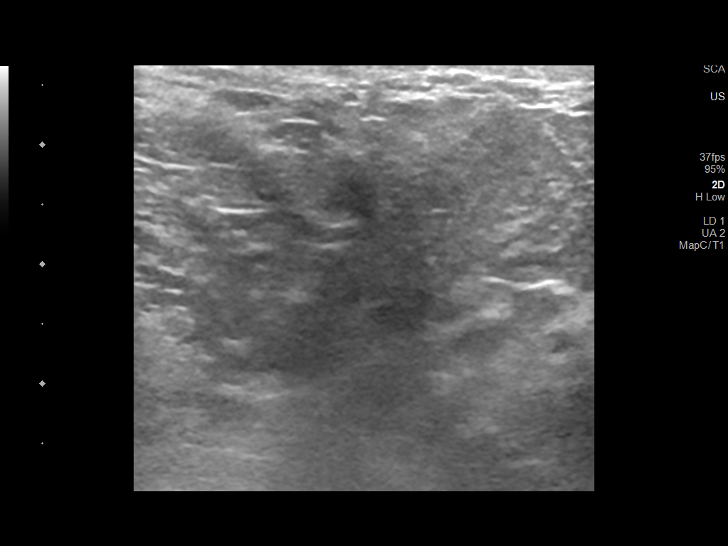

[14 of 25 positions shown; findings below may reference images not displayed]

FINDINGS: Right testicle

Measurements: 3.9 x 1.9 x 2.3 cm. No mass or microlithiasis
visualized.

Left testicle

Measurements: 3.8 x 2.3 x 2.7 cm. No mass or microlithiasis
visualized.

Right epididymis:  Normal in size and appearance.

Left epididymis:  Normal in size and appearance.

Hydrocele:  Trace left hydrocele.  No right hydrocele.

Varicocele:  Mild left varicocele.  No right varicocele

Pulsed Doppler interrogation of both testes demonstrates normal low
resistance arterial and venous waveforms bilaterally.

Other: There is diffuse scrotal and perineal skin thickening without
discernible abscess or collection.
IMPRESSION: No evidence of testicular mass or torsion.

Diffuse scrotal and perineal skin thickening. No visible soft tissue
collection or discernible abscess.

Trace left hydrocele and small left varicocele.

## 2024-05-21 ENCOUNTER — Ambulatory Visit (HOSPITAL_COMMUNITY)
Admission: EM | Admit: 2024-05-21 | Discharge: 2024-05-21 | Disposition: A | Discharge status: Home / Self Care | Payer: Self-pay | Attending: Student | Admitting: Student

## 2024-05-21 ENCOUNTER — Other Ambulatory Visit: Payer: Self-pay

## 2024-05-21 ENCOUNTER — Emergency Department (HOSPITAL_COMMUNITY)
Admission: EM | Admit: 2024-05-21 | Discharge: 2024-05-21 | Discharge status: Against Medical Advice | Payer: Self-pay | Attending: Emergency Medicine | Admitting: Emergency Medicine

## 2024-05-21 ENCOUNTER — Encounter (HOSPITAL_COMMUNITY): Payer: Self-pay

## 2024-05-21 DIAGNOSIS — R110 Nausea: Secondary | ICD-10-CM | POA: Insufficient documentation

## 2024-05-21 DIAGNOSIS — E119 Type 2 diabetes mellitus without complications: Secondary | ICD-10-CM

## 2024-05-21 DIAGNOSIS — Z5321 Procedure and treatment not carried out due to patient leaving prior to being seen by health care provider: Secondary | ICD-10-CM | POA: Insufficient documentation

## 2024-05-21 DIAGNOSIS — R739 Hyperglycemia, unspecified: Secondary | ICD-10-CM | POA: Insufficient documentation

## 2024-05-21 DIAGNOSIS — R531 Weakness: Secondary | ICD-10-CM | POA: Insufficient documentation

## 2024-05-21 LAB — URINALYSIS, ROUTINE W REFLEX MICROSCOPIC
Bilirubin Urine: NEGATIVE
Glucose, UA: >=500 mg/dL — AB
Hgb urine dipstick: NEGATIVE
Ketones, ur: 80 mg/dL — AB
Leukocytes,Ua: NEGATIVE
Nitrite: NEGATIVE
Protein, ur: 30 mg/dL — AB
Specific Gravity, Urine: 1.035 — ABNORMAL HIGH (ref 1.005–1.030)
pH: 5 (ref 5.0–8.0)

## 2024-05-21 LAB — CBC
HCT: 48.1 % (ref 39.0–52.0)
Hemoglobin: 16.1 g/dL (ref 13.0–17.0)
MCH: 29.4 pg (ref 26.0–34.0)
MCHC: 33.5 g/dL (ref 30.0–36.0)
MCV: 87.9 fL (ref 80.0–100.0)
Platelets: 347 10*3/uL (ref 150–400)
RBC: 5.47 MIL/uL (ref 4.22–5.81)
RDW: 12.8 % (ref 11.5–15.5)
WBC: 7.6 10*3/uL (ref 4.0–10.5)
nRBC: 0 % (ref 0.0–0.2)

## 2024-05-21 LAB — I-STAT CHEM 8, ED
BUN: 6 mg/dL (ref 6–20)
Calcium, Ion: 1.2 mmol/L (ref 1.15–1.40)
Chloride: 102 mmol/L (ref 98–111)
Creatinine, Ser: 0.6 mg/dL — ABNORMAL LOW (ref 0.61–1.24)
Glucose, Bld: 340 mg/dL — ABNORMAL HIGH (ref 70–99)
HCT: 52 % (ref 39.0–52.0)
Hemoglobin: 17.7 g/dL — ABNORMAL HIGH (ref 13.0–17.0)
Potassium: 3.8 mmol/L (ref 3.5–5.1)
Sodium: 138 mmol/L (ref 135–145)
TCO2: 18 mmol/L — ABNORMAL LOW (ref 22–32)

## 2024-05-21 LAB — POCT URINE DIPSTICK
Bilirubin, UA: NEGATIVE
Glucose, UA: >=1000 mg/dL — AB
Leukocytes, UA: NEGATIVE
Nitrite, UA: NEGATIVE
POC PROTEIN,UA: NEGATIVE
Spec Grav, UA: 1.01
Urobilinogen, UA: 0.2 U/dL
pH, UA: 5

## 2024-05-21 LAB — GLUCOSE, POCT (MANUAL RESULT ENTRY): POCT Glucose (KUC): 403 mg/dL — AB (ref 70–99)

## 2024-05-21 NOTE — Discharge Instructions (Addendum)
-  Based on your labwork today, you have diabetes.  -If your blood sugar remains this high, you could enter a coma, or have a seizure. You will need IV fluids, insulin, and labwork. This must be done in the ER.  -The ER can also set you up with resources to make sure you can continue insulin at home.  -Please head straight to the ER. If symptoms change or worsen on the way, stop and call 911.

## 2024-05-21 NOTE — ED Notes (Signed)
 Patient is being discharged from the Urgent Care and sent to the Emergency Department via POV . Per Leita Molly, PA, patient is in need of higher level of care due to hyperglycemia. Patient is aware and verbalizes understanding of plan of care.  Vitals:   05/21/24 1447  BP: 124/86  Pulse: 97  Resp: 18  Temp: 98.1 F (36.7 C)  SpO2: 98%

## 2024-05-21 NOTE — ED Triage Notes (Signed)
 Pt c/o polyuria and polydipsia x3 weeks.  Pt denies Hx of DM.  Pt was seen at [facility] and CBG 403.

## 2024-05-21 NOTE — ED Provider Notes (Signed)
 " [facility]    CSN: [id number] Arrival date & time: 05/21/24  1326      History   Chief Complaint Chief Complaint  Patient presents with   Urinary Frequency   dry mouth   Nausea   Dizziness    HPI Dylan Carpenter is a 34 y.o. male Patient reports that he has had nausea, urinary frequency, nausea (almost vomited), fatigue and dizziness x 3 weeks. Dizziness feels like lightheadedness, worse with ambulation. Patient states he has to urinate every 30 minutes during the night.    HPI  Past Medical History:  Diagnosis Date   Asthma    Obesity     There are no active problems to display for this patient.   Past Surgical History:  Procedure Laterality Date   DENTAL SURGERY         Home Medications    Prior to Admission medications  Medication Sig Start Date End Date Taking? Authorizing Provider  fluticasone  (FLONASE ) 50 MCG/ACT nasal spray Place 1 spray into both nostrils daily. Patient not taking: Reported on 03/04/2019 07/28/18 03/04/19  Hedges, Reyes, PA-C  omeprazole  (PRILOSEC) 20 MG capsule Take 1 capsule (20 mg total) by mouth daily. Patient not taking: Reported on 03/04/2019 10/06/18 03/04/19  Nettie, April, MD    Family History Family History  Problem Relation Age of Onset   Diabetes Mother     Social History Social History[1]   Allergies   Benadryl [diphenhydramine hcl (sleep)] and Kiwi extract   Review of Systems Review of Systems  Genitourinary:  Positive for frequency.  Neurological:  Positive for light-headedness.     Physical Exam Triage Vital Signs ED Triage Vitals [05/21/24 1447]  Encounter Vitals Group     BP 124/86     Girls Systolic BP Percentile      Girls Diastolic BP Percentile      Boys Systolic BP Percentile      Boys Diastolic BP Percentile      Pulse Rate 97     Resp 18     Temp 98.1 F (36.7 C)     Temp Source Oral     SpO2 98 %     Weight      Height      Head Circumference      Peak Flow       Pain Score 0     Pain Loc      Pain Education      Exclude from Growth Chart    No data found.  Updated Vital Signs BP 124/86 (BP Location: Right Arm)   Pulse 97   Temp 98.1 F (36.7 C) (Oral)   Resp 18   SpO2 98%   Visual Acuity Right Eye Distance:   Left Eye Distance:   Bilateral Distance:    Right Eye Near:   Left Eye Near:    Bilateral Near:     Physical Exam Vitals reviewed.  Constitutional:      General: He is not in acute distress.    Appearance: Normal appearance. He is not ill-appearing or diaphoretic.  HENT:     Head: Normocephalic and atraumatic.  Cardiovascular:     Rate and Rhythm: Normal rate and regular rhythm.     Heart sounds: Normal heart sounds.  Pulmonary:     Effort: Pulmonary effort is normal.     Breath sounds: Normal breath sounds.  Skin:    General: Skin is warm.  Neurological:     General: No  focal deficit present.     Mental Status: He is alert and oriented to person, place, and time.  Psychiatric:        Mood and Affect: Mood normal.        Behavior: Behavior normal.        Thought Content: Thought content normal.        Judgment: Judgment normal.      UC Treatments / Results  Labs (all labs ordered are listed, but only abnormal results are displayed) Labs Reviewed  GLUCOSE, POCT (MANUAL RESULT ENTRY) - Abnormal; Notable for the following components:      Result Value   POCT Glucose ([facility]) 403 (*)    All other components within normal limits  POCT URINE DIPSTICK - Abnormal; Notable for the following components:   Glucose, UA >=1,000 (*)    Ketones, POC UA >= (160) (*)    Blood, UA trace-intact (*)    All other components within normal limits    EKG   Radiology No results found.  Procedures Procedures (including critical care time)  Medications Ordered in UC Medications - No data to display  Initial Impression / Assessment and Plan / UC Course  I have reviewed the triage vital signs and the nursing  notes.  Pertinent labs & imaging results that were available during my care of the patient were reviewed by me and considered in my medical decision making (see chart for details).     Patient is a pleasant 34 y.o. male presenting with hyperglycemia. The patient is afebrile and nontachycardic.   Nonfasting CBG 403. UA with glucose greater than 1000.  I have concern for DKA.  Patient with nausea and lightheadedness.  Following discussion, the patient will proceed to the emergency department for further evaluation.  Discussed with Dr. Rolinda.  Final Clinical Impressions(s) / UC Diagnoses   Final diagnoses:  Nausea without vomiting  Newly diagnosed diabetes [facility])     Discharge Instructions      -Based on your labwork today, you have diabetes.  -If your blood sugar remains this high, you could enter a coma, or have a seizure. You will need IV fluids, insulin, and labwork. This must be done in the ER.  -The ER can also set you up with resources to make sure you can continue insulin at home.  -Please head straight to the ER. If symptoms change or worsen on the way, stop and call 911.      ED Prescriptions   None    PDMP not reviewed this encounter.     [1]  Social History Tobacco Use   Smoking status: Never   Smokeless tobacco: Never  Vaping Use   Vaping status: Never Used  Substance Use Topics   Alcohol use: No   Drug use: No     Arlyss Leita BRAVO, PA-C 05/21/24 1529  "

## 2024-05-21 NOTE — ED Notes (Signed)
 Pt stated he was leaving.

## 2024-05-21 NOTE — ED Triage Notes (Signed)
 Pt came in via POV from UC d/t his CBG was 403, he has dry mouth, polyuria, increased thirst & he came in for eval. A/Ox4, denies nay current pain just endorses extreme fatigue.

## 2024-05-21 NOTE — ED Triage Notes (Signed)
 Patient reports that he has had nausea, urinary frequency, nausea and dizziness x 3 weeks. Patient states he has to urinate every 30 minutes during the might.

## 2024-05-21 NOTE — ED Provider Triage Note (Signed)
 Emergency Medicine Provider Triage Evaluation Note  Dylan Carpenter , a 34 y.o. male  was evaluated in triage.  Pt complains of generalized weakness ongoing for the past several months.  Patient was seen at his primary care today who relayed that his blood glucose reading was over 400 and to report to the emergency department.  Patient also endorses symptoms such as nausea, urinary frequency, increased thirst, wax wane abdominal pain.  Patient currently feels weak however is in no acute distress.  Review of Systems  Positive: weakness, nausea, hyperglycemia  Negative: Fevers   Physical Exam  BP (!) 141/93 (BP Location: Right Arm)   Pulse 99   Temp 99.2 F (37.3 C) (Oral)   Resp 20   Ht 6' (1.829 m)   SpO2 99%   BMI 40.69 kg/m  Gen:   Awake, no distress   Resp:  Normal effort  MSK:   Moves extremities without difficulty  Other:    Medical Decision Making  Medically screening exam initiated at 5:25 PM.  Appropriate orders placed.  Dylan Carpenter was informed that the remainder of the evaluation will be completed by another provider, this initial triage assessment does not replace that evaluation, and the importance of remaining in the ED until their evaluation is complete.     Dylan Carpenter, Dylan Carpenter 05/21/24 1731

## 2024-06-22 ENCOUNTER — Encounter: Payer: Self-pay | Admitting: Dietician
